# Patient Record
Sex: Female | Born: 1970 | Race: Asian | Hispanic: No | Marital: Single | Smoking: Never smoker
Health system: Southern US, Community
[De-identification: ages and names within clinical notes are randomized; demographics above are authoritative.]

## PROBLEM LIST (undated history)

## (undated) DIAGNOSIS — E7800 Pure hypercholesterolemia, unspecified: Secondary | ICD-10-CM

---

## 2003-09-24 ENCOUNTER — Emergency Department (HOSPITAL_COMMUNITY): Admission: EM | Admit: 2003-09-24 | Discharge: 2003-09-24 | Payer: Self-pay | Admitting: Emergency Medicine

## 2003-09-26 ENCOUNTER — Encounter: Admission: RE | Admit: 2003-09-26 | Discharge: 2003-09-26 | Payer: Self-pay | Admitting: Emergency Medicine

## 2003-12-26 ENCOUNTER — Emergency Department (HOSPITAL_COMMUNITY): Admission: EM | Admit: 2003-12-26 | Discharge: 2003-12-26 | Payer: Self-pay | Admitting: Family Medicine

## 2004-07-14 ENCOUNTER — Emergency Department (HOSPITAL_COMMUNITY): Admission: EM | Admit: 2004-07-14 | Discharge: 2004-07-14 | Payer: Self-pay | Admitting: Family Medicine

## 2004-08-03 ENCOUNTER — Emergency Department (HOSPITAL_COMMUNITY): Admission: EM | Admit: 2004-08-03 | Discharge: 2004-08-03 | Payer: Self-pay | Admitting: Emergency Medicine

## 2004-09-28 ENCOUNTER — Ambulatory Visit (HOSPITAL_COMMUNITY): Admission: RE | Admit: 2004-09-28 | Discharge: 2004-09-28 | Payer: Self-pay | Admitting: Family Medicine

## 2004-12-29 ENCOUNTER — Inpatient Hospital Stay (HOSPITAL_COMMUNITY): Admission: AD | Admit: 2004-12-29 | Discharge: 2004-12-30 | Payer: Self-pay | Admitting: *Deleted

## 2004-12-29 ENCOUNTER — Ambulatory Visit: Payer: Self-pay | Admitting: Family Medicine

## 2004-12-30 ENCOUNTER — Inpatient Hospital Stay (HOSPITAL_COMMUNITY): Admission: AD | Admit: 2004-12-30 | Discharge: 2004-12-30 | Payer: Self-pay | Admitting: Obstetrics and Gynecology

## 2004-12-30 ENCOUNTER — Ambulatory Visit: Payer: Self-pay | Admitting: Obstetrics and Gynecology

## 2005-01-15 ENCOUNTER — Inpatient Hospital Stay (HOSPITAL_COMMUNITY): Admission: AD | Admit: 2005-01-15 | Discharge: 2005-01-15 | Payer: Self-pay | Admitting: Obstetrics & Gynecology

## 2005-01-15 ENCOUNTER — Ambulatory Visit: Payer: Self-pay | Admitting: *Deleted

## 2005-03-11 ENCOUNTER — Inpatient Hospital Stay (HOSPITAL_COMMUNITY): Admission: AD | Admit: 2005-03-11 | Discharge: 2005-03-11 | Payer: Self-pay | Admitting: Obstetrics and Gynecology

## 2005-03-13 ENCOUNTER — Ambulatory Visit: Payer: Self-pay | Admitting: Obstetrics & Gynecology

## 2005-03-13 ENCOUNTER — Inpatient Hospital Stay (HOSPITAL_COMMUNITY): Admission: AD | Admit: 2005-03-13 | Discharge: 2005-03-16 | Payer: Self-pay | Admitting: Obstetrics & Gynecology

## 2005-03-19 ENCOUNTER — Inpatient Hospital Stay (HOSPITAL_COMMUNITY): Admission: AD | Admit: 2005-03-19 | Discharge: 2005-03-19 | Payer: Self-pay | Admitting: *Deleted

## 2005-06-10 ENCOUNTER — Emergency Department (HOSPITAL_COMMUNITY): Admission: EM | Admit: 2005-06-10 | Discharge: 2005-06-10 | Payer: Self-pay | Admitting: Emergency Medicine

## 2005-11-17 ENCOUNTER — Emergency Department (HOSPITAL_COMMUNITY): Admission: EM | Admit: 2005-11-17 | Discharge: 2005-11-17 | Payer: Self-pay | Admitting: Family Medicine

## 2006-05-17 ENCOUNTER — Encounter: Admission: RE | Admit: 2006-05-17 | Discharge: 2006-05-17 | Payer: Self-pay | Admitting: Nephrology

## 2006-05-17 ENCOUNTER — Other Ambulatory Visit: Admission: RE | Admit: 2006-05-17 | Discharge: 2006-05-17 | Payer: Self-pay | Admitting: Nephrology

## 2006-07-05 ENCOUNTER — Inpatient Hospital Stay (HOSPITAL_COMMUNITY): Admission: AD | Admit: 2006-07-05 | Discharge: 2006-07-07 | Payer: Self-pay | Admitting: Nephrology

## 2006-07-06 ENCOUNTER — Ambulatory Visit: Payer: Self-pay | Admitting: Internal Medicine

## 2006-11-14 ENCOUNTER — Other Ambulatory Visit: Admission: RE | Admit: 2006-11-14 | Discharge: 2006-11-14 | Payer: Self-pay | Admitting: Nephrology

## 2008-01-23 ENCOUNTER — Emergency Department (HOSPITAL_COMMUNITY): Admission: EM | Admit: 2008-01-23 | Discharge: 2008-01-23 | Payer: Self-pay | Admitting: Emergency Medicine

## 2008-08-05 ENCOUNTER — Emergency Department (HOSPITAL_COMMUNITY): Admission: EM | Admit: 2008-08-05 | Discharge: 2008-08-05 | Payer: Self-pay | Admitting: Emergency Medicine

## 2010-03-15 ENCOUNTER — Encounter: Payer: Self-pay | Admitting: Obstetrics & Gynecology

## 2010-08-14 ENCOUNTER — Ambulatory Visit
Admission: RE | Admit: 2010-08-14 | Discharge: 2010-08-14 | Disposition: A | Discharge status: Home / Self Care | Payer: Medicaid Other | Source: Ambulatory Visit | Attending: Nephrology | Admitting: Nephrology

## 2010-08-14 ENCOUNTER — Other Ambulatory Visit: Payer: Self-pay | Admitting: Nephrology

## 2010-08-14 DIAGNOSIS — W19XXXA Unspecified fall, initial encounter: Secondary | ICD-10-CM

## 2010-08-14 DIAGNOSIS — R52 Pain, unspecified: Secondary | ICD-10-CM

## 2011-10-18 ENCOUNTER — Encounter (HOSPITAL_COMMUNITY): Payer: Self-pay | Admitting: *Deleted

## 2011-10-18 ENCOUNTER — Emergency Department (HOSPITAL_COMMUNITY)
Admission: EM | Admit: 2011-10-18 | Discharge: 2011-10-18 | Disposition: A | Discharge status: Home / Self Care | Payer: Medicaid Other | Source: Home / Self Care | Attending: Emergency Medicine | Admitting: Emergency Medicine

## 2011-10-18 DIAGNOSIS — R21 Rash and other nonspecific skin eruption: Secondary | ICD-10-CM

## 2011-10-18 MED ORDER — PREDNISONE 10 MG PO TABS: ORAL_TABLET | ORAL | Status: DC

## 2011-10-18 NOTE — ED Notes (Signed)
Pt  Reports  She  Was  Stung   1  Week  Ago  By  [employer]  While  Working in  Her  Garden  She  Reports  Itching        And  Rash  Present   -  No  angiodema          -  No distress       -  Speaking in  Complete  sentances     The  Stings  Are  Mainly on  Extremities

## 2011-10-18 NOTE — ED Provider Notes (Signed)
History     CSN: [id number]  Arrival date & time 10/18/11  1322   First MD Initiated Contact with Patient 10/18/11 1526      Chief Complaint  Patient presents with  . Insect Bite    (Consider location/radiation/quality/duration/timing/severity/associated sxs/prior treatment) Patient is a 41 y.o. female presenting with rash. The history is provided by the patient. No language interpreter was used.  Rash  This is a new problem. The problem has been gradually worsening. The problem is associated with an insect bite/sting. There has been no fever. The pain has been constant since onset. Associated symptoms include itching.   Pt reports she was stung by a bee and now has a rash.  History reviewed. No pertinent past medical history.  History reviewed. No pertinent past surgical history.  No family history on file.  History  Substance Use Topics  . Smoking status: Never Smoker   . Smokeless tobacco: Not on file  . Alcohol Use: No    OB History    Grav Para Term Preterm Abortions TAB SAB Ect Mult Living                  Review of Systems  Skin: Positive for itching and rash.    Allergies  Review of patient's allergies indicates not on file.  Home Medications   Current Outpatient Rx  Name Route Sig Dispense Refill  . PREDNISONE 10 MG PO TABS  6,6,5,5,4,4,3,3,2,2,1,1 taper 42 tablet 0    BP 124/59  Pulse 83  Temp 98.3 F (36.8 C) (Oral)  Resp 16  SpO2 98%  Physical Exam  Nursing note and vitals reviewed. Constitutional: She appears well-developed and well-nourished.  HENT:  Head: Normocephalic and atraumatic.  Musculoskeletal:       Rash upper arms and legs,  Looks like poison ivy  Neurological: She is alert. She has normal reflexes.  Skin: Rash noted.  Psychiatric: She has a normal mood and affect.    ED Course  Procedures (including critical care time)  Labs Reviewed - No data to display No results found.   1. Skin rash       MDM     Prednisone taper      Elson Areas, [place] 10/18/11 1552

## 2011-10-18 NOTE — ED Provider Notes (Signed)
Medical screening examination/treatment/procedure(s) were performed by non-physician practitioner and as supervising physician I was immediately available for consultation/collaboration.  Raynald Blend, MD 10/18/11 [phone]

## 2011-10-26 ENCOUNTER — Telehealth: Payer: Self-pay | Admitting: *Deleted

## 2011-10-26 NOTE — Telephone Encounter (Signed)
Message copied by Jose Persia on Tue Oct 26, 2011 11:08 AM ------      Message from: Darci Needle      Created: Tue Oct 26, 2011 10:32 AM       Need NPI for patient that was at [facility] last Mon, 8/26      [facility] - [id number]

## 2011-10-26 NOTE — Telephone Encounter (Signed)
Patient was seen at [facility] urgent care for rash.  Patient does not have a PCP, but our office is listed on Medicaid card.  Due to patient having Medicaid, office calling to request NPI #  to authorize appt.  NPI # given.  Gaylene Brooks, RN

## 2011-12-09 ENCOUNTER — Other Ambulatory Visit: Payer: Self-pay | Admitting: Obstetrics

## 2011-12-09 DIAGNOSIS — Z1231 Encounter for screening mammogram for malignant neoplasm of breast: Secondary | ICD-10-CM

## 2011-12-23 ENCOUNTER — Ambulatory Visit (HOSPITAL_COMMUNITY)
Admission: RE | Admit: 2011-12-23 | Discharge: 2011-12-23 | Disposition: A | Discharge status: Home / Self Care | Payer: Medicaid Other | Source: Ambulatory Visit | Attending: Obstetrics | Admitting: Obstetrics

## 2011-12-23 DIAGNOSIS — Z1231 Encounter for screening mammogram for malignant neoplasm of breast: Secondary | ICD-10-CM

## 2011-12-27 ENCOUNTER — Other Ambulatory Visit: Payer: Self-pay | Admitting: Obstetrics

## 2011-12-27 DIAGNOSIS — R928 Other abnormal and inconclusive findings on diagnostic imaging of breast: Secondary | ICD-10-CM

## 2012-01-05 ENCOUNTER — Other Ambulatory Visit: Payer: Medicaid Other

## 2012-03-20 ENCOUNTER — Emergency Department (HOSPITAL_COMMUNITY)
Admission: EM | Admit: 2012-03-20 | Discharge: 2012-03-20 | Disposition: A | Discharge status: Home / Self Care | Payer: Medicaid Other | Attending: Emergency Medicine | Admitting: Emergency Medicine

## 2012-03-20 ENCOUNTER — Encounter (HOSPITAL_COMMUNITY): Payer: Self-pay | Admitting: Emergency Medicine

## 2012-03-20 ENCOUNTER — Emergency Department (HOSPITAL_COMMUNITY): Payer: Medicaid Other

## 2012-03-20 DIAGNOSIS — Y939 Activity, unspecified: Secondary | ICD-10-CM | POA: Insufficient documentation

## 2012-03-20 DIAGNOSIS — S93609A Unspecified sprain of unspecified foot, initial encounter: Secondary | ICD-10-CM | POA: Insufficient documentation

## 2012-03-20 DIAGNOSIS — W010XXA Fall on same level from slipping, tripping and stumbling without subsequent striking against object, initial encounter: Secondary | ICD-10-CM | POA: Insufficient documentation

## 2012-03-20 DIAGNOSIS — Y9289 Other specified places as the place of occurrence of the external cause: Secondary | ICD-10-CM | POA: Insufficient documentation

## 2012-03-20 MED ORDER — DICLOFENAC SODIUM 1 % TD GEL: 2.0000 g | Freq: Four times a day (QID) | TRANSDERMAL | Status: DC

## 2012-03-20 MED ORDER — IBUPROFEN 600 MG PO TABS: 600.0000 mg | ORAL_TABLET | Freq: Four times a day (QID) | ORAL | Status: DC | PRN

## 2012-03-20 NOTE — ED Provider Notes (Signed)
Medical screening examination/treatment/procedure(s) were performed by non-physician practitioner and as supervising physician I was immediately available for consultation/collaboration.   Reynoldo Mainer, MD 03/20/12 1824 

## 2012-03-20 NOTE — ED Notes (Signed)
Fell 3 days ago and hurt  Left  Ankle pain can walk  But hurts

## 2012-03-20 NOTE — ED Provider Notes (Signed)
History     CSN: [id number]  Arrival date & time 03/20/12  1210   First MD Initiated Contact with Patient 03/20/12 1535      Chief Complaint  Patient presents with  . Foot Pain    (Consider location/radiation/quality/duration/timing/severity/associated sxs/prior treatment) HPI Comments: Patient tripped and fell in a parking lot 3 days ago. States she has had L ankle and foot pain since that time. No treatments PTA. Denies other injury. Walking with pain. No weakness, numbness, tingling. Onset acute. Course constant. Aggravating factors: none. Alleviating factors: none.    The history is provided by the patient.    History reviewed. No pertinent past medical history.  No past surgical history on file.  No family history on file.  History  Substance Use Topics  . Smoking status: Never Smoker   . Smokeless tobacco: Not on file  . Alcohol Use: No    OB History    Grav Para Term Preterm Abortions TAB SAB Ect Mult Living                  Review of Systems  Constitutional: Negative for activity change.  HENT: Negative for neck pain.   Musculoskeletal: Positive for arthralgias. Negative for back pain, joint swelling and gait problem.  Skin: Negative for wound.  Neurological: Negative for weakness and numbness.    Allergies  Review of patient's allergies indicates no known allergies.  Home Medications   Current Outpatient Rx  Name  Route  Sig  Dispense  Refill  . DICLOFENAC SODIUM 1 % TD GEL   Topical   Apply 2 g topically 4 (four) times daily.   1 Tube   0   . IBUPROFEN 600 MG PO TABS   Oral   Take 1 tablet (600 mg total) by mouth every 6 (six) hours as needed for pain.   20 tablet   0     BP 113/51  Pulse 82  Temp 98.4 F (36.9 C) (Oral)  Resp 18  SpO2 100%  Physical Exam  Nursing note and vitals reviewed. Constitutional: She appears well-developed and well-nourished.  HENT:  Head: Normocephalic and atraumatic.  Eyes: Pupils are equal, round,  and reactive to light.  Neck: Normal range of motion. Neck supple.  Cardiovascular: Exam reveals no decreased pulses.   Musculoskeletal: She exhibits tenderness. She exhibits no edema.       Left knee: Normal.       Left ankle: tenderness. Medial malleolus tenderness found. No lateral malleolus, no head of 5th metatarsal and no proximal fibula tenderness found. Achilles tendon normal.       Left lower leg: Normal.       Left foot: She exhibits tenderness and bony tenderness. She exhibits normal range of motion, no swelling and normal capillary refill.       Feet:  Neurological: She is alert. No sensory deficit.       Motor, sensation, and vascular distal to the injury is fully intact.   Skin: Skin is warm and dry.  Psychiatric: She has a normal mood and affect.    ED Course  Procedures (including critical care time)  Labs Reviewed - No data to display Dg Ankle Complete Left  03/20/2012  *RADIOLOGY REPORT*  Clinical Data: Larey Seat.  Pain lateral malleolus.  LEFT ANKLE COMPLETE - 3+ VIEW  Comparison: None.  Findings: No fracture or dislocation.  IMPRESSION: No fracture.   Original Report Authenticated By: Lacy Duverney, M.D.      1.  Sprain of foot     3:52 PM Patient seen and examined.    Vital signs reviewed and are as follows: Filed Vitals:   03/20/12 1244  BP: 113/51  Pulse: 82  Temp: 98.4 F (36.9 C)  Resp: 18   Pt informed of results. She wants topical medication for pain.   Patient was counseled on RICE protocol and told to rest injury, use ice for no longer than 15 minutes every hour, compress the area, and elevate above the level of their heart as much as possible to reduce swelling.  Questions answered.  Patient verbalized understanding.      MDM  X-ray neg, foot sprain. RICE with NSAIDs indicated. PCP f/u if not improving.         Renne Crigler, [place] 03/20/12 [phone]

## 2012-04-03 ENCOUNTER — Other Ambulatory Visit: Payer: Medicaid Other

## 2012-04-21 ENCOUNTER — Other Ambulatory Visit: Payer: Medicaid Other

## 2013-02-08 ENCOUNTER — Encounter (HOSPITAL_COMMUNITY): Payer: Self-pay | Admitting: Emergency Medicine

## 2013-02-08 ENCOUNTER — Emergency Department (HOSPITAL_COMMUNITY)
Admission: EM | Admit: 2013-02-08 | Discharge: 2013-02-08 | Disposition: A | Discharge status: Home / Self Care | Payer: Medicaid Other | Attending: Emergency Medicine | Admitting: Emergency Medicine

## 2013-02-08 DIAGNOSIS — L02219 Cutaneous abscess of trunk, unspecified: Secondary | ICD-10-CM | POA: Insufficient documentation

## 2013-02-08 DIAGNOSIS — L0291 Cutaneous abscess, unspecified: Secondary | ICD-10-CM

## 2013-02-08 MED ORDER — SULFAMETHOXAZOLE-TRIMETHOPRIM 800-160 MG PO TABS: 1.0000 | ORAL_TABLET | Freq: Two times a day (BID) | ORAL | Status: AC

## 2013-02-08 NOTE — ED Notes (Signed)
Pt reports right groin pain x 3 days, denies n/v/d or urinary symptoms.

## 2013-02-08 NOTE — ED Notes (Signed)
Nurse first rounding, pt updated on wait.

## 2013-02-08 NOTE — ED Provider Notes (Signed)
CSN: [id number]     Arrival date & time 02/08/13  1218 History   First MD Initiated Contact with Patient 02/08/13 1608     Chief Complaint  Patient presents with  . Groin Pain   (Consider location/radiation/quality/duration/timing/severity/associated sxs/prior Treatment) HPI Bonnie Stewart is a 42 year old female with no significant past medical history who presents today with a small abscess in her right inguinal area. Patient states has been ongoing for 3 days. The patient says she's had this before approximately 6 months ago and was treated with antibiotics and that it resolved. She denies any fever nausea vomiting diarrhea. Denies any vaginal bleeding any vaginal discharge.  Patient is having mild pain in the right groin no alleviating or associated signs or symptoms no radiation of the pain pain is sharp.  History reviewed. No pertinent past medical history. History reviewed. No pertinent past surgical history. History reviewed. No pertinent family history. History  Substance Use Topics  . Smoking status: Never Smoker   . Smokeless tobacco: Not on file  . Alcohol Use: No   OB History   Grav Para Term Preterm Abortions TAB SAB Ect Mult Living                 Review of Systems  Constitutional: Negative for fever and chills.  HENT: Negative for sore throat.   Eyes: Negative for pain.  Respiratory: Negative for cough and shortness of breath.   Cardiovascular: Negative for chest pain.  Gastrointestinal: Negative for nausea, vomiting, abdominal pain and diarrhea.  Genitourinary: Negative for dysuria.  Musculoskeletal: Negative for back pain.  Skin: Positive for wound. Negative for rash.  Neurological: Negative for numbness and headaches.    Allergies  Review of patient's allergies indicates no known allergies.  Home Medications   Current Outpatient Rx  Name  Route  Sig  Dispense  Refill  . sulfamethoxazole-trimethoprim (BACTRIM DS,SEPTRA DS) 800-160 MG per tablet   Oral  Take 1 tablet by mouth 2 (two) times daily.   14 tablet   0    BP 115/75  Pulse 81  Temp(Src) 97.1 F (36.2 C) (Oral)  Resp 18  Ht 5\' 3"  (1.6 m)  Wt 137 lb (62.143 kg)  BMI 24.27 kg/m2  SpO2 99% Physical Exam  Constitutional: She is oriented to person, place, and time. She appears well-developed and well-nourished. No distress.  HENT:  Head: Normocephalic and atraumatic.  Eyes: Pupils are equal, round, and reactive to light. Right eye exhibits no discharge. Left eye exhibits no discharge.  Neck: Normal range of motion.  Cardiovascular: Normal rate, regular rhythm and normal heart sounds.   Pulmonary/Chest: Effort normal and breath sounds normal.  Abdominal: Soft. She exhibits no distension. There is no tenderness.  Musculoskeletal: Normal range of motion.  Neurological: She is alert and oriented to person, place, and time.  Skin: Skin is warm. Rash (small lesion at R groin, <30mm, indurated, erythematous. ) noted. She is not diaphoretic.    ED Course  Procedures (including critical care time) Labs Review Labs Reviewed - No data to display Imaging Review No results found.  EKG Interpretation   None       MDM   1. Abscess    42 yo F with small abscess in R groin. No fluctuance, doubt I&D will help at this time. Will treat as outpatient for cellulitis. Strict return precautions given, with instructions to follow-up with PCP in 2-3 days for wound recheck. Will d/c with bactrim for antibiotic treatment. Patient in agreement  with plan. Discharged in stable condition.         Imagene Sheller, MD 02/09/13 [phone]

## 2013-02-09 NOTE — ED Provider Notes (Signed)
I saw and evaluated the patient, reviewed the resident's note and I agree with the findings and plan.  EKG Interpretation   None       Patient not systemically ill. Not large enough for drainage. Will treat with abx.  Audree Camel, MD 02/09/13 [phone]

## 2013-02-20 ENCOUNTER — Ambulatory Visit: Payer: Medicaid Other

## 2013-02-27 ENCOUNTER — Ambulatory Visit: Payer: Medicaid Other | Attending: Internal Medicine | Admitting: Internal Medicine

## 2013-02-27 ENCOUNTER — Encounter: Payer: Self-pay | Admitting: Internal Medicine

## 2013-02-27 VITALS — BP 122/82 | HR 77 | Temp 98.7°F | Resp 14 | Ht 63.0 in | Wt 137.2 lb

## 2013-02-27 DIAGNOSIS — R5381 Other malaise: Secondary | ICD-10-CM

## 2013-02-27 DIAGNOSIS — R5383 Other fatigue: Secondary | ICD-10-CM

## 2013-02-27 DIAGNOSIS — K649 Unspecified hemorrhoids: Secondary | ICD-10-CM | POA: Insufficient documentation

## 2013-02-27 LAB — CBC WITH DIFFERENTIAL/PLATELET
Basophils Absolute: 0 10*3/uL (ref 0.0–0.1)
Basophils Relative: 0 % (ref 0–1)
EOS PCT: 1 % (ref 0–5)
Eosinophils Absolute: 0.1 10*3/uL (ref 0.0–0.7)
HCT: 37.9 % (ref 36.0–46.0)
HEMOGLOBIN: 12.6 g/dL (ref 12.0–15.0)
LYMPHS ABS: 1.5 10*3/uL (ref 0.7–4.0)
LYMPHS PCT: 23 % (ref 12–46)
MCH: 29 pg (ref 26.0–34.0)
MCHC: 33.2 g/dL (ref 30.0–36.0)
MCV: 87.1 fL (ref 78.0–100.0)
MONO ABS: 0.6 10*3/uL (ref 0.1–1.0)
Monocytes Relative: 9 % (ref 3–12)
NEUTROS ABS: 4.4 10*3/uL (ref 1.7–7.7)
NEUTROS PCT: 67 % (ref 43–77)
Platelets: 373 10*3/uL (ref 150–400)
RBC: 4.35 MIL/uL (ref 3.87–5.11)
RDW: 13.3 % (ref 11.5–15.5)
WBC: 6.5 10*3/uL (ref 4.0–10.5)

## 2013-02-27 MED ORDER — SENNOSIDES-DOCUSATE SODIUM 8.6-50 MG PO TABS: 1.0000 | ORAL_TABLET | Freq: Every day | ORAL | Status: DC

## 2013-02-27 MED ORDER — PHENYLEPH-SHARK LIV OIL-MO-PET 0.25-3-14-71.9 % RE OINT: 1.0000 "application " | TOPICAL_OINTMENT | Freq: Two times a day (BID) | RECTAL | Status: DC | PRN

## 2013-02-27 MED ORDER — HYDROCORTISONE ACETATE 25 MG RE SUPP: 25.0000 mg | Freq: Two times a day (BID) | RECTAL | Status: DC

## 2013-02-27 NOTE — Progress Notes (Signed)
Pt is here to establish care. Complains of having hemorrhoids x8 years; no pain. Requests a check up.

## 2013-02-27 NOTE — Patient Instructions (Signed)
B?nh Tr? (Hemorrhoids) Tr? l cc t?nh m?ch b? s?ng xung quanh tr?c trng ho?c h?u mn. C hai lo?i tr?:  Tr? n?i. Lo?i ny xu?t hi?n trong cc t?nh m?ch ngay bn trong tr?c trng. Chng c th? ch?c xuyn qua bn ngoi v b? kch thch v ?au.  Tr? ngo?i. Lo?i ny xu?t hi?n ? cc t?nh m?ch bn ngoi h?u mn v c th? c?m th?y s?ng ?au ho?c m?t c?c s?ng c?ng g?n h?u mn. [place].  Bo ph.  To bn ho?c tiu ch?y.  R?n ?? ??i ti?n.  Ng?i lu trong nh v? sinh.  Nng v?t n?ng ho?c ho?t ??ng khc khi?n b?n c?ng th?ng.  Giao h?p qua ???ng h?u mn. TRI?U CH?NG  ?au.  Ng?a ho?c kch thch h?u mn.  Ch?y mu tr?c trng.  R r? phn.  S?ng h?u mn.  M?t ho?c nhi?u c?c xung quanh h?u mn. CH?N ?ON Chuyn gia ch?m [place] s?c kh?e c th? ch?n ?on b?nh tr? b?ng cch khm b?ng m?t th??ng. Cc ki?m tra v xt nghi?m khc c th? ???c th?c hi?n bao g?m:  Ki?m tra khu v?c tr?c trng b?ng tay ?eo g?ng (ki?m tra tr?c trng b?ng ngn tay).  Ki?m tra ?ng h?u mn b?ng cch s? d?ng m?t ?ng nh? (?ng soi).  Xt nghi?m mu n?u b?n b? m?t m?t l??ng mu ?ng k?.  M?t ki?m tra ?? xem bn trong ??i trng (soi ??i trng sigma ho??c soi ??i trng). ?I?U TR? H?u h?t b?nh tr? c th? ???c ?i?u tr? t?i nh. Tuy nhin, n?u cc tri?u ch?ng c v? khng kh h?n ho?c n?u b?n b? ch?y mu tr?c trng nhi?u, chuyn gia ch?m [place] s?c kh?e c th? th?c hi?n m?t th? thu?t ?? gip lm cho tr? nh? h?n ho?c lo?i b? chng hon ton. Cc ph??ng php ?i?u tr? c th? bao g?m:  ??t m?t b?ng cao su t?i chn tr? ?? c?t ??t tu?n hon mu (th?t ??ng m?ch b?ng b?ng cao su).  Tim ha ch?t ?? thu nh? tr? (li?u php x? ho).  S? d?ng m?t d?ng c? ?? ??t tr? (li?u php nh sng h?ng ngo?i).  Ph?u thu?t lo?i b? tr? (th? thu?t c?t tr?).  K?p tr? ?? ch?n dng mu ??n m (k?p tr?). H??NG D?N CH?M [place] T?I NH  ?n th?c ?n c nhi?u ch?t x? nh? ng? c?c, ??u, cc lo?i h?t, tri cy v rau. Hy h?i bc s? v? vi?c dng  cc s?n ph?m c b? sung ch?t x? (b? sung ch?t x?).  U?ng nhi?u n??c h?n. U?ng ?? n??c v dung d?ch ?? n??c ti?u trong ho?c c mu vng nh?t.  T?p th? d?c th??ng xuyn.  ?i v? sinh khi b?n c nhu c?u. Khng ch?.  Trnh r?n khi ?i ??i ti?n.  Gi? vng h?u mn kh v s?ch s?. S? d?ng gi?y v? sinh ??t ho?c kh?n gi?y ?m sau khi ??i ti?n.  Thu?c kem v thu?c ??n c th? ???c s? d?ng ho?c p d?ng theo ch? d?n.  Ch? s? d?ng thu?c khng c?n k toa ho?c thu?c c?n k toa theo ch? d?n c?a chuyn gia ch?m [place] s?c kh?e.  T?m ng?i trong n??c ?m kho?ng 15-20 pht, 3-4 l?n m?t ngy ?? gi?m ?au v kh ch?u.  Ch??m ? l?nh vo ch? tr? n?u n nh?y c?m ?au v s?ng ln. Vi?c s? d?ng cc gi ch??m ? gi?a cc l?n t?m ng?i c th? h?u ch.  Cho ?  l?nh vo ti nh?a.  ??t kh?n t?m gi?a da v ti.  ?? ? l?nh kho?ng 15-20 pht, 3-4 l?n m?t ngy.  Khng s? d?ng g?i hnh bnh rn ho?c ng?i lu trn b?n c?u. ?i?u ny lm t?ng tch t? mu v ?au. HY ?I KHM N?U:  B?n b? ?au v s?ng t?ng ln khng ki?m sot ???c b?ng ?i?u tr? ho?c thu?c.  B?n b? ch?y mu khng ki?m sot ???c.  B?n g?p kh kh?n ho?c khng th? ?i ??i ti?n.  B?n b? ?au ho?c vim nhi?m bn ngoi khu v?c tr?Marland Kitchen. ??M B?O B?N:  Hi?u cc h??ng d?n ny.  S? theo di tnh tr?ng c?a mnh.  S? yu c?u tr? gip ngay l?p t?c n?u b?n c?m th?y khng ?? ho?c tnh tr?ng tr?m tr?ng h?n. Document Released: 11/18/2004 Document Revised: 10/11/2012 [facility]Exit[facility] Patient Information 2014 Homer C JonesExitCare, MarylandLLC.

## 2013-02-27 NOTE — Progress Notes (Signed)
Patient ID: Bonnie Stewart, female   DOB: March 23, 1970, 43 y.o.   MRN: [id number]   CC: Physical exam  HPI: Patient is 43 year old female who presents to clinic for regular physical exam and wants medication for hemorrhoids. She claims she is feeling well and denies any specific concerns such as chest pain or shortness of breath, no abdominal or urinary symptoms. She's not taking any medication. She explains ever since her first pregnancy she has noted problems with hemorrhoids and has tendency to be constipated. She is watching her diet regularly but it is not always helpful in preventing constipation.  No Known Allergies History reviewed. No pertinent past medical history. No current outpatient prescriptions on file prior to visit.   No current facility-administered medications on file prior to visit.   No known family medical history History   Social History  . Marital Status: Single    Spouse Name: N/A    Number of Children: N/A  . Years of Education: N/A   Occupational History  . Not on file.   Social History Main Topics  . Smoking status: Never Smoker   . Smokeless tobacco: Not on file  . Alcohol Use: No  . Drug Use:   . Sexual Activity:    Other Topics Concern  . Not on file   Social History Narrative  . No narrative on file    Review of Systems  Constitutional: Negative for fever, chills, diaphoresis, activity change, appetite change and fatigue.  HENT: Negative for ear pain, nosebleeds, congestion, facial swelling, rhinorrhea, neck pain, neck stiffness and ear discharge.   Eyes: Negative for pain, discharge, redness, itching and visual disturbance.  Respiratory: Negative for cough, choking, chest tightness, shortness of breath, wheezing and stridor.   Cardiovascular: Negative for chest pain, palpitations and leg swelling.  Gastrointestinal: Negative for abdominal distention.  Genitourinary: Negative for dysuria, urgency, frequency, hematuria, flank pain, decreased  urine volume, difficulty urinating and dyspareunia.  Musculoskeletal: Negative for back pain, joint swelling, arthralgias and gait problem.  Neurological: Negative for dizziness, tremors, seizures, syncope, facial asymmetry, speech difficulty, weakness, light-headedness, numbness and headaches.  Hematological: Negative for adenopathy. Does not bruise/bleed easily.  Psychiatric/Behavioral: Negative for hallucinations, behavioral problems, confusion, dysphoric mood, decreased concentration and agitation.    Objective:   Filed Vitals:   02/27/13 1121  BP: 122/82  Pulse: 77  Temp: 98.7 F (37.1 C)  Resp: 14    Physical Exam  Constitutional: Appears well-developed and well-nourished. No distress.  HENT: Normocephalic. External right and left ear normal. Oropharynx is clear and moist.  Eyes: Conjunctivae and EOM are normal. PERRLA, no scleral icterus.  Neck: Normal ROM. Neck supple. No JVD. No tracheal deviation. No thyromegaly.  CVS: RRR, S1/S2 +, no murmurs, no gallops, no carotid bruit.  Pulmonary: Effort and breath sounds normal, no stridor, rhonchi, wheezes, rales.  Abdominal: Soft. BS +,  no distension, tenderness, rebound or guarding.  Musculoskeletal: Normal range of motion. No edema and no tenderness.  Lymphadenopathy: No lymphadenopathy noted, cervical, inguinal. Neuro: Alert. Normal reflexes, muscle tone coordination. No cranial nerve deficit. Skin: Skin is warm and dry. No rash noted. Not diaphoretic. No erythema. No pallor.  Psychiatric: Normal mood and affect. Behavior, judgment, thought content normal.   No results found for this basename: WBC, HGB, HCT, MCV, PLT   No results found for this basename: CREATININE, BUN, NA, K, CL, CO2    No results found for this basename: HGBA1C   Lipid Panel  No results found for  this basename: chol, trig, hdl, cholhdl, vldl, ldlcalc       Assessment and plan:   Hemorrhoids - will prescribe Anusol suppository and rectal ointment,  constipation management discussed in detail and patient verbalized understanding. We'll provide prescription for Senokot to take twice daily as needed. If any bleeding noted patient advised to call us back so she can be seen as soon as possible. General physical exam - stable with no acute findings, we'll check electrolyte panel, TSH and CBC, lipid panel

## 2013-02-28 LAB — COMPREHENSIVE METABOLIC PANEL
ALK PHOS: 44 U/L (ref 39–117)
ALT: 15 U/L (ref 0–35)
AST: 15 U/L (ref 0–37)
Albumin: 4.2 g/dL (ref 3.5–5.2)
BUN: 10 mg/dL (ref 6–23)
CHLORIDE: 107 meq/L (ref 96–112)
CO2: 26 meq/L (ref 19–32)
CREATININE: 0.5 mg/dL (ref 0.50–1.10)
Calcium: 9 mg/dL (ref 8.4–10.5)
GLUCOSE: 88 mg/dL (ref 70–99)
Potassium: 3.7 mEq/L (ref 3.5–5.3)
Sodium: 141 mEq/L (ref 135–145)
Total Bilirubin: 0.5 mg/dL (ref 0.3–1.2)
Total Protein: 7.1 g/dL (ref 6.0–8.3)

## 2013-02-28 LAB — LIPID PANEL
Cholesterol: 178 mg/dL (ref 0–200)
HDL: 46 mg/dL (ref >39)
LDL CALC: 122 mg/dL — AB (ref 0–99)
TRIGLYCERIDES: 50 mg/dL (ref <150)
Total CHOL/HDL Ratio: 3.9 Ratio
VLDL: 10 mg/dL (ref 0–40)

## 2013-02-28 LAB — TSH: TSH: 2.1 u[IU]/mL (ref 0.350–4.500)

## 2013-05-28 ENCOUNTER — Ambulatory Visit: Payer: Medicaid Other | Admitting: Internal Medicine

## 2013-06-07 ENCOUNTER — Encounter: Payer: Self-pay | Admitting: Obstetrics

## 2013-06-07 ENCOUNTER — Ambulatory Visit (INDEPENDENT_AMBULATORY_CARE_PROVIDER_SITE_OTHER): Payer: Medicaid Other | Admitting: Obstetrics

## 2013-06-07 VITALS — BP 116/77 | HR 79 | Temp 98.1°F | Ht 63.0 in | Wt 135.0 lb

## 2013-06-07 DIAGNOSIS — N841 Polyp of cervix uteri: Secondary | ICD-10-CM

## 2013-06-07 DIAGNOSIS — K649 Unspecified hemorrhoids: Secondary | ICD-10-CM | POA: Insufficient documentation

## 2013-06-07 DIAGNOSIS — Z0000 Encounter for general adult medical examination without abnormal findings: Secondary | ICD-10-CM

## 2013-06-07 DIAGNOSIS — R519 Headache, unspecified: Secondary | ICD-10-CM | POA: Insufficient documentation

## 2013-06-07 DIAGNOSIS — Z1239 Encounter for other screening for malignant neoplasm of breast: Secondary | ICD-10-CM

## 2013-06-07 DIAGNOSIS — R51 Headache: Secondary | ICD-10-CM

## 2013-06-07 DIAGNOSIS — Z124 Encounter for screening for malignant neoplasm of cervix: Secondary | ICD-10-CM

## 2013-06-07 DIAGNOSIS — Z113 Encounter for screening for infections with a predominantly sexual mode of transmission: Secondary | ICD-10-CM

## 2013-06-07 MED ORDER — IBUPROFEN 800 MG PO TABS: 800.0000 mg | ORAL_TABLET | Freq: Three times a day (TID) | ORAL | Status: DC | PRN

## 2013-06-07 MED ORDER — HYDROCORTISONE ACETATE 25 MG RE SUPP: 25.0000 mg | Freq: Two times a day (BID) | RECTAL | Status: DC

## 2013-06-07 NOTE — Progress Notes (Signed)
Subjective:     Bonnie Stewart is a 43 y.o. female here for a routine exam.  Current complaints: Patient is in the office today for a problem visit. Patient states she is having problems with her Hemorids and is unable to go to the bathroom. Patient states she isn't sure if she still has her IUD.  The HPI was reviewed and explored in further detail by the provider. Gynecologic History No LMP recorded. Patient is not currently having periods (Reason: IUD). Contraception: IUD Last Pap: 2014. Results were: normal Last mammogram: Patient states she has not had a mammogram yet.   Obstetric History OB History  Gravida Para Term Preterm AB SAB TAB Ectopic Multiple Living  2 2 2       2     # Outcome Date GA Lbr Len/2nd Weight Sex Delivery Anes PTL Lv  2 TRM 03/17/05    M SVD None  Y  1 TRM 09/13/01    F SVD None  Y      History reviewed. No pertinent past medical history.  History reviewed. No pertinent past surgical history.   (Not in a hospital admission) No Known Allergies  History  Substance Use Topics  . Smoking status: Never Smoker   . Smokeless tobacco: Not on file  . Alcohol Use: No    History reviewed. No pertinent family history.    Review of Systems  Constitutional: negative for fatigue and weight loss Respiratory: negative for cough and wheezing Cardiovascular: negative for chest pain, fatigue and palpitations Gastrointestinal: negative for abdominal pain and change in bowel habits Musculoskeletal:negative for myalgias Neurological: negative for gait problems and tremors Behavioral/Psych: negative for abusive relationship, depression Endocrine: negative for temperature intolerance   Genitourinary:negative for abnormal menstrual periods, genital lesions, hot flashes, sexual problems and vaginal discharge Integument/breast: negative for breast lump, breast tenderness, nipple discharge and skin lesion(s)    Objective:       General:   alert  Skin:   no rash or  abnormalities  Lungs:   clear to auscultation bilaterally  Heart:   regular rate and rhythm, S1, S2 normal, no murmur, click, rub or gallop  Breasts:   normal without suspicious masses, skin or nipple changes or axillary nodes  Abdomen:  normal findings: no organomegaly, soft, non-tender and no hernia  Pelvis:  External genitalia: normal general appearance Urinary system: urethral meatus normal and bladder without fullness, nontender Vaginal: normal without tenderness, induration or masses Cervix: endocervical polyp.  IUD string visible. Adnexa: normal bimanual exam Uterus: anteverted and non-tender, normal size   Lab Review Urine pregnancy test Labs reviewed yes Radiologic studies reviewed no  Orders Placed This Encounter  Procedures  . GC/Chlamydia Probe Amp  . WET PREP BY MOLECULAR PROBE  . MM Digital Screening    Standing Status: Future     Number of Occurrences:      Standing Expiration Date: 08/08/2014    Order Specific Question:  Reason for Exam (SYMPTOM  OR DIAGNOSIS REQUIRED)    Answer:  screening    Order Specific Question:  Is the patient pregnant?    Answer:  No    Order Specific Question:  [facility]    [facility]   Meds ordered this encounter  Medications  . hydrocortisone (ANUSOL-HC) 25 MG suppository    Sig: Place 1 suppository (25 mg total) rectally 2 (two) times daily.    Dispense:  30 suppository    Refill:  6  . ibuprofen (  ADVIL,MOTRIN) 800 MG tablet    Sig: Take 1 tablet (800 mg total) by mouth every 8 (eight) hours as needed.    Dispense:  30 tablet    Refill:  5    Assessment:    Healthy female exam.   Endocervical polyp  Headaches.  Wants Ibuprofen.   Plan:    Education reviewed: safe sex/STD prevention, self breast exams and management of endocervical polyps.. Contraception: IUD. Mammogram ordered. Follow up in: 2 weeks.   Need to obtain previous records Possible management options include:Endocervical  polypectomy Follow up as needed. Polypectomy of endocervical polyp next visit

## 2013-06-08 LAB — WET PREP BY MOLECULAR PROBE
CANDIDA SPECIES: NEGATIVE
GARDNERELLA VAGINALIS: POSITIVE — AB
TRICHOMONAS VAG: NEGATIVE

## 2013-06-08 LAB — PAP IG W/ RFLX HPV ASCU

## 2013-06-08 LAB — GC/CHLAMYDIA PROBE AMP
CT Probe RNA: NEGATIVE
GC Probe RNA: NEGATIVE

## 2013-06-26 ENCOUNTER — Ambulatory Visit: Payer: Medicaid Other | Admitting: Obstetrics

## 2013-07-03 ENCOUNTER — Ambulatory Visit (HOSPITAL_COMMUNITY): Payer: Medicaid Other

## 2013-07-04 ENCOUNTER — Ambulatory Visit (HOSPITAL_COMMUNITY): Admission: RE | Admit: 2013-07-04 | Payer: Medicaid Other | Source: Ambulatory Visit

## 2013-07-10 ENCOUNTER — Other Ambulatory Visit: Payer: Self-pay | Admitting: Obstetrics

## 2013-07-10 ENCOUNTER — Ambulatory Visit (INDEPENDENT_AMBULATORY_CARE_PROVIDER_SITE_OTHER): Payer: Medicaid Other | Admitting: Obstetrics

## 2013-07-10 ENCOUNTER — Encounter: Payer: Self-pay | Admitting: Obstetrics

## 2013-07-10 VITALS — BP 105/64 | HR 89 | Temp 99.1°F | Ht 64.0 in | Wt 134.0 lb

## 2013-07-10 DIAGNOSIS — B9689 Other specified bacterial agents as the cause of diseases classified elsewhere: Secondary | ICD-10-CM | POA: Insufficient documentation

## 2013-07-10 DIAGNOSIS — N760 Acute vaginitis: Secondary | ICD-10-CM

## 2013-07-10 DIAGNOSIS — A499 Bacterial infection, unspecified: Secondary | ICD-10-CM

## 2013-07-10 DIAGNOSIS — N841 Polyp of cervix uteri: Secondary | ICD-10-CM

## 2013-07-10 DIAGNOSIS — Z1239 Encounter for other screening for malignant neoplasm of breast: Secondary | ICD-10-CM

## 2013-07-10 MED ORDER — METRONIDAZOLE 500 MG PO TABS: 500.0000 mg | ORAL_TABLET | Freq: Two times a day (BID) | ORAL | Status: DC

## 2013-07-11 NOTE — Progress Notes (Signed)
Patient ID: Bonnie Stewart, female   DOB: 21-Sep-1970, 43 y.o.   MRN: [id number]  Chief Complaint  Patient presents with  . Follow-up    HPI Bonnie Stewart is a 43 y.o. female.  Presents for follow up of mucous polyp found on last exam.  HPI  History reviewed. No pertinent past medical history.  History reviewed. No pertinent past surgical history.  History reviewed. No pertinent family history.  Social History History  Substance Use Topics  . Smoking status: Never Smoker   . Smokeless tobacco: Not on file  . Alcohol Use: No    No Known Allergies  Current Outpatient Prescriptions  Medication Sig Dispense Refill  . ibuprofen (ADVIL,MOTRIN) 800 MG tablet Take 1 tablet (800 mg total) by mouth every 8 (eight) hours as needed.  30 tablet  5  . metroNIDAZOLE (FLAGYL) 500 MG tablet Take 1 tablet (500 mg total) by mouth 2 (two) times daily.  14 tablet  2   No current facility-administered medications for this visit.    Review of Systems Review of Systems Constitutional: negative for fatigue and weight loss Respiratory: negative for cough and wheezing Cardiovascular: negative for chest pain, fatigue and palpitations Gastrointestinal: negative for abdominal pain and change in bowel habits Genitourinary:negative Integument/breast: negative for nipple discharge Musculoskeletal:negative for myalgias Neurological: negative for gait problems and tremors Behavioral/Psych: negative for abusive relationship, depression Endocrine: negative for temperature intolerance     Blood pressure 105/64, pulse 89, temperature 99.1 F (37.3 C), height 5\' 4"  (1.626 m), weight 134 lb (60.782 kg).  Physical Exam Physical Exam General:   alert and no distress. Abdomen:  Soft, NT. Pelvic:  Cervix visualized on SSE an small endocervical polyp noted.  The polyp was grasped with dressing forcep and twisted off at base.  The specimen was submitted to pathology.  Grey, thin malodorous vaginal discharge noted.   Wet prep done.   Data Reviewed Labs  Assessment    Endocervical polyp. BV      Plan   Endocervical polypectomy done.  Metronidazole Rx for BV.    Orders Placed This Encounter  Procedures  . MM Digital Screening    Standing Status: Future     Number of Occurrences:      Standing Expiration Date: 09/10/2014    Order Specific Question:  Reason for Exam (SYMPTOM  OR DIAGNOSIS REQUIRED)    Answer:  Screening    Order Specific Question:  Is the patient pregnant?    Answer:  No    Order Specific Question:  Preferred imaging location?    [facility]   Meds ordered this encounter  Medications  . metroNIDAZOLE (FLAGYL) 500 MG tablet    Sig: Take 1 tablet (500 mg total) by mouth 2 (two) times daily.    Dispense:  14 tablet    Refill:  2      Brock Badharles A Arian Murley 07/11/2013, 2:39 AM

## 2013-12-13 ENCOUNTER — Other Ambulatory Visit: Payer: Self-pay | Admitting: Obstetrics

## 2013-12-13 ENCOUNTER — Encounter: Payer: Self-pay | Admitting: Obstetrics

## 2013-12-13 ENCOUNTER — Ambulatory Visit (INDEPENDENT_AMBULATORY_CARE_PROVIDER_SITE_OTHER): Payer: Medicaid Other | Admitting: Obstetrics

## 2013-12-13 VITALS — BP 122/80 | HR 77 | Temp 98.2°F | Ht 63.0 in | Wt 140.0 lb

## 2013-12-13 DIAGNOSIS — N760 Acute vaginitis: Secondary | ICD-10-CM

## 2013-12-13 DIAGNOSIS — Z975 Presence of (intrauterine) contraceptive device: Secondary | ICD-10-CM

## 2013-12-13 DIAGNOSIS — Z1239 Encounter for other screening for malignant neoplasm of breast: Secondary | ICD-10-CM

## 2013-12-13 DIAGNOSIS — Z23 Encounter for immunization: Secondary | ICD-10-CM

## 2013-12-13 DIAGNOSIS — B9689 Other specified bacterial agents as the cause of diseases classified elsewhere: Secondary | ICD-10-CM

## 2013-12-13 DIAGNOSIS — G4700 Insomnia, unspecified: Secondary | ICD-10-CM | POA: Insufficient documentation

## 2013-12-13 DIAGNOSIS — A499 Bacterial infection, unspecified: Secondary | ICD-10-CM

## 2013-12-13 DIAGNOSIS — R52 Pain, unspecified: Secondary | ICD-10-CM

## 2013-12-13 MED ORDER — IBUPROFEN 800 MG PO TABS: 800.0000 mg | ORAL_TABLET | Freq: Three times a day (TID) | ORAL | Status: DC | PRN

## 2013-12-13 MED ORDER — ZOLPIDEM TARTRATE 10 MG PO TABS: 5.0000 mg | ORAL_TABLET | Freq: Every evening | ORAL | Status: DC | PRN

## 2013-12-13 MED ORDER — METRONIDAZOLE 500 MG PO TABS: 500.0000 mg | ORAL_TABLET | Freq: Two times a day (BID) | ORAL | Status: DC

## 2013-12-13 MED ORDER — ZOLPIDEM TARTRATE 5 MG PO TABS: 5.0000 mg | ORAL_TABLET | Freq: Every evening | ORAL | Status: DC | PRN

## 2013-12-13 NOTE — Addendum Note (Signed)
Addended by: Odessa FlemingBOHNE, Nyajah Hyson M on: 12/13/2013 04:50 PM   Modules accepted: Orders, SmartSet

## 2013-12-13 NOTE — Progress Notes (Signed)
Patient ID: Bonnie Stewart, female   DOB: 1970-10-16, 43 y.o.   MRN: [name]  Chief Complaint  Patient presents with  . Follow-up    IUD     HPI Bonnie Stewart is a 43 y.o. female.  Malodorous vaginal discharge.  HPI  History reviewed. No pertinent past medical history.  History reviewed. No pertinent past surgical history.  History reviewed. No pertinent family history.  Social History History  Substance Use Topics  . Smoking status: Never Smoker   . Smokeless tobacco: Not on file  . Alcohol Use: No    No Known Allergies  Current Outpatient Prescriptions  Medication Sig Dispense Refill  . ibuprofen (ADVIL,MOTRIN) 800 MG tablet Take 1 tablet (800 mg total) by mouth every 8 (eight) hours as needed.  30 tablet  5  . metroNIDAZOLE (FLAGYL) 500 MG tablet Take 1 tablet (500 mg total) by mouth 2 (two) times daily.  14 tablet  2  . zolpidem (AMBIEN) 5 MG tablet Take 1 tablet (5 mg total) by mouth at bedtime as needed for sleep.  30 tablet  2   No current facility-administered medications for this visit.    Review of Systems Review of Systems Constitutional: negative for fatigue and weight loss Respiratory: negative for cough and wheezing Cardiovascular: negative for chest pain, fatigue and palpitations Gastrointestinal: negative for abdominal pain and change in bowel habits Genitourinary: positive for malodorous vaginal discharge Integument/breast: negative for nipple discharge Musculoskeletal:negative for myalgias Neurological: negative for gait problems and tremors Behavioral/Psych: negative for abusive relationship, depression Endocrine: negative for temperature intolerance     Blood pressure 122/80, pulse 77, temperature 98.2 F (36.8 C), height 5\' 3"  (1.6 m), weight 140 lb (63.504 kg).  Physical Exam Physical Exam General:   alert  Skin:   no rash or abnormalities  Lungs:   clear to auscultation bilaterally  Heart:   regular rate and rhythm, S1, S2 normal, no  murmur, click, rub or gallop  Breasts:   normal without suspicious masses, skin or nipple changes or axillary nodes  Abdomen:  normal findings: no organomegaly, soft, non-tender and no hernia  Pelvis:  External genitalia: normal general appearance Urinary system: urethral meatus normal and bladder without fullness, nontender Vaginal: grey, thin malodorous discharge Cervix: normal appearance.  IUD string visible. Adnexa: normal bimanual exam Uterus: anteverted and non-tender, normal size      Data Reviewed Labs  Assessment    Malodorous vaginal discharge - BV. IUD Surveillance.  Pleased with IUD. Insomnia     Plan   Metronidazole Rx Continue IUD. ( inserted in 2012 ) Ambien Rx  Orders Placed This Encounter  Procedures  . MM Digital Screening    [place]/medicaid/[id number].    Standing Status: Future     Number of Occurrences:      Standing Expiration Date: 02/13/2015    Order Specific Question:  Reason for Exam (SYMPTOM  OR DIAGNOSIS REQUIRED)    Answer:  screening    Order Specific Question:  Is the patient pregnant?    Answer:  No    Order Specific Question:  Preferred imaging location?    Answer[facility]   Meds ordered this encounter  Medications  . metroNIDAZOLE (FLAGYL) 500 MG tablet    Sig: Take 1 tablet (500 mg total) by mouth 2 (two) times daily.    Dispense:  14 tablet    Refill:  2  . DISCONTD: zolpidem (AMBIEN) 10 MG tablet    Sig: Take 0.5 tablets (  5 mg total) by mouth at bedtime as needed for sleep.    Dispense:  30 tablet    Refill:  3  . ibuprofen (ADVIL,MOTRIN) 800 MG tablet    Sig: Take 1 tablet (800 mg total) by mouth every 8 (eight) hours as needed.    Dispense:  30 tablet    Refill:  5  . zolpidem (AMBIEN) 5 MG tablet    Sig: Take 1 tablet (5 mg total) by mouth at bedtime as needed for sleep.    Dispense:  30 tablet    Refill:  2       Carlo Guevarra A 12/13/2013, 11:29 AM

## 2013-12-13 NOTE — Addendum Note (Signed)
Addended by: Odessa FlemingBOHNE, Ritter Helsley M on: 12/13/2013 11:56 AM   Modules accepted: Orders

## 2013-12-14 LAB — WET PREP BY MOLECULAR PROBE
CANDIDA SPECIES: NEGATIVE
Gardnerella vaginalis: POSITIVE — AB
TRICHOMONAS VAG: NEGATIVE

## 2013-12-14 LAB — GC/CHLAMYDIA PROBE AMP
CT Probe RNA: NEGATIVE
GC Probe RNA: NEGATIVE

## 2013-12-15 ENCOUNTER — Other Ambulatory Visit: Payer: Self-pay | Admitting: Obstetrics

## 2013-12-24 ENCOUNTER — Encounter: Payer: Self-pay | Admitting: Obstetrics

## 2014-01-08 ENCOUNTER — Other Ambulatory Visit: Payer: Self-pay | Admitting: Obstetrics

## 2014-01-08 ENCOUNTER — Ambulatory Visit (HOSPITAL_COMMUNITY)
Admission: RE | Admit: 2014-01-08 | Discharge: 2014-01-08 | Disposition: A | Discharge status: Home / Self Care | Payer: Medicaid Other | Source: Ambulatory Visit | Attending: Obstetrics | Admitting: Obstetrics

## 2014-01-08 DIAGNOSIS — Z1231 Encounter for screening mammogram for malignant neoplasm of breast: Secondary | ICD-10-CM

## 2014-01-08 DIAGNOSIS — Z1239 Encounter for other screening for malignant neoplasm of breast: Secondary | ICD-10-CM

## 2014-01-10 ENCOUNTER — Other Ambulatory Visit: Payer: Self-pay | Admitting: Obstetrics

## 2014-01-10 DIAGNOSIS — R928 Other abnormal and inconclusive findings on diagnostic imaging of breast: Secondary | ICD-10-CM

## 2014-04-08 ENCOUNTER — Ambulatory Visit (INDEPENDENT_AMBULATORY_CARE_PROVIDER_SITE_OTHER): Payer: Medicaid Other | Admitting: Obstetrics

## 2014-04-08 VITALS — BP 127/81 | HR 85 | Wt 141.0 lb

## 2014-04-08 DIAGNOSIS — N898 Other specified noninflammatory disorders of vagina: Secondary | ICD-10-CM

## 2014-04-08 DIAGNOSIS — T8332XA Displacement of intrauterine contraceptive device, initial encounter: Secondary | ICD-10-CM

## 2014-04-08 DIAGNOSIS — T8389XA Other specified complication of genitourinary prosthetic devices, implants and grafts, initial encounter: Secondary | ICD-10-CM

## 2014-04-08 DIAGNOSIS — R52 Pain, unspecified: Secondary | ICD-10-CM

## 2014-04-08 MED ORDER — IBUPROFEN 800 MG PO TABS: 800.0000 mg | ORAL_TABLET | Freq: Three times a day (TID) | ORAL | Status: DC | PRN

## 2014-04-08 NOTE — Progress Notes (Signed)
Patient ID: Bonnie Stewart, female   DOB: [birth year], 44 y.o.   MRN: [id number]  Chief Complaint  Patient presents with  . Follow-up    IUD    HPI Bonnie Fothergilluong V Applegate is a 44 y.o. female.  Feels like IUD may have fallen out.Marland Kitchen.   HPI  No past medical history on file.  No past surgical history on file.  No family history on file.  Social History History  Substance Use Topics  . Smoking status: Never Smoker   . Smokeless tobacco: Not on file  . Alcohol Use: No    No Known Allergies  Current Outpatient Prescriptions  Medication Sig Dispense Refill  . ibuprofen (ADVIL,MOTRIN) 800 MG tablet Take 1 tablet (800 mg total) by mouth every 8 (eight) hours as needed. 30 tablet 5  . metroNIDAZOLE (FLAGYL) 500 MG tablet Take 1 tablet (500 mg total) by mouth 2 (two) times daily. (Patient not taking: Reported on 04/08/2014) 14 tablet 2  . zolpidem (AMBIEN) 5 MG tablet Take 1 tablet (5 mg total) by mouth at bedtime as needed for sleep. (Patient not taking: Reported on 04/08/2014) 30 tablet 2   No current facility-administered medications for this visit.    Review of Systems Review of Systems Constitutional: negative for fatigue and weight loss Respiratory: negative for cough and wheezing Cardiovascular: negative for chest pain, fatigue and palpitations Gastrointestinal: negative for abdominal pain and change in bowel habits Genitourinary: mild cramping Integument/breast: negative for nipple discharge Musculoskeletal:negative for myalgias Neurological: negative for gait problems and tremors Behavioral/Psych: negative for abusive relationship, depression Endocrine: negative for temperature intolerance     Blood pressure 127/81, pulse 85, weight 141 lb (63.957 kg).  Physical Exam Physical Exam General:   alert  Skin:   no rash or abnormalities  Lungs:   clear to auscultation bilaterally  Heart:   regular rate and rhythm, S1, S2 normal, no murmur, click, rub or gallop  Breasts:   normal  without suspicious masses, skin or nipple changes or axillary nodes  Abdomen:  normal findings: no organomegaly, soft, non-tender and no hernia  Pelvis:  External genitalia: normal general appearance Urinary system: urethral meatus normal and bladder without fullness, nontender Vaginal: normal without tenderness, induration or masses.  Increased grey, thin discharge, no odor. Cervix: normal appearance.  IUD string not visible. Adnexa: normal bimanual exam Uterus: retroverted and non-tender, normal size      Data Reviewed IUD insertion date  Assessment     Possible expulsion or malposition of IUD Vaginal discharge.      Plan    Ultrasound ordered.  Will call patient with results. Wet prep and cultures sent. F/U in 2 months for annual exam.   Orders Placed This Encounter  Procedures  . SureSwab, Vaginosis/Vaginitis Plus  . US Transvaginal Non-OB    Standing Status: Future     Number of Occurrences:      Standing Expiration Date: 06/07/2015    Order Specific Question:  Reason for Exam (SYMPTOM  OR DIAGNOSIS REQUIRED)    Answer:  iud placement    Order Specific Question:  Preferred imaging location?    Answer:  [facility]  . US Pelvis Complete    Standing Status: Future     Number of Occurrences:      Standing Expiration Date: 06/07/2015    Order Specific Question:  Reason for Exam (SYMPTOM  OR DIAGNOSIS REQUIRED)    Answer:  iud placement    Order Specific Question:  Preferred imaging location?  Answer:  [facility]  . US Transvaginal Non-OB    Standing Status: Future     Number of Occurrences:      Standing Expiration Date: 06/07/2015    Order Specific Question:  Reason for Exam (SYMPTOM  OR DIAGNOSIS REQUIRED)    Answer:  iud placement    Order Specific Question:  Preferred imaging location?    Answer:  [facility]   Meds ordered this encounter  Medications  . ibuprofen (ADVIL,MOTRIN) 800 MG tablet    Sig: Take 1 tablet (800 mg total) by mouth every 8  (eight) hours as needed.    Dispense:  30 tablet    Refill:  5

## 2014-04-09 ENCOUNTER — Ambulatory Visit (HOSPITAL_COMMUNITY)
Admission: RE | Admit: 2014-04-09 | Discharge: 2014-04-09 | Disposition: A | Discharge status: Home / Self Care | Payer: Medicaid Other | Source: Ambulatory Visit | Attending: Obstetrics | Admitting: Obstetrics

## 2014-04-09 ENCOUNTER — Encounter: Payer: Self-pay | Admitting: Obstetrics

## 2014-04-09 DIAGNOSIS — T8389XA Other specified complication of genitourinary prosthetic devices, implants and grafts, initial encounter: Secondary | ICD-10-CM | POA: Insufficient documentation

## 2014-04-09 DIAGNOSIS — T8332XA Displacement of intrauterine contraceptive device, initial encounter: Secondary | ICD-10-CM

## 2014-04-11 LAB — SURESWAB, VAGINOSIS/VAGINITIS PLUS
Atopobium vaginae: NOT DETECTED Log (cells/mL)
C. GLABRATA, DNA: NOT DETECTED
C. albicans, DNA: NOT DETECTED
C. parapsilosis, DNA: NOT DETECTED
C. trachomatis RNA, TMA: NOT DETECTED
C. tropicalis, DNA: NOT DETECTED
Gardnerella vaginalis: 4.8 Log (cells/mL)
LACTOBACILLUS SPECIES: >8 Log (cells/mL)
MEGASPHAERA SPECIES: NOT DETECTED Log (cells/mL)
N. gonorrhoeae RNA, TMA: NOT DETECTED
T. VAGINALIS RNA, QL TMA: NOT DETECTED

## 2014-07-29 ENCOUNTER — Telehealth: Payer: Self-pay | Admitting: *Deleted

## 2014-07-29 NOTE — Telephone Encounter (Signed)
Patient had called requesting an appointment -she fell in her bathroom. Declined triage by [facility] 16:04 patient left message request call back. 5:47 LM on VM- patient needs to go to Urgent care for check up- that is not a GYN problem and she may need Xray.

## 2014-08-01 ENCOUNTER — Ambulatory Visit: Payer: Medicaid Other | Admitting: Obstetrics

## 2014-08-12 ENCOUNTER — Ambulatory Visit: Payer: Medicaid Other | Admitting: Obstetrics

## 2014-08-29 ENCOUNTER — Ambulatory Visit: Payer: Medicaid Other | Admitting: Obstetrics

## 2014-11-28 ENCOUNTER — Encounter (HOSPITAL_COMMUNITY): Payer: Self-pay | Admitting: *Deleted

## 2014-11-28 ENCOUNTER — Inpatient Hospital Stay (HOSPITAL_COMMUNITY)
Admission: AD | Admit: 2014-11-28 | Discharge: 2014-11-28 | Disposition: A | Discharge status: Home / Self Care | Payer: Self-pay | Source: Ambulatory Visit | Attending: Obstetrics | Admitting: Obstetrics

## 2014-11-28 DIAGNOSIS — N889 Noninflammatory disorder of cervix uteri, unspecified: Secondary | ICD-10-CM

## 2014-11-28 LAB — URINALYSIS, ROUTINE W REFLEX MICROSCOPIC
Bilirubin Urine: NEGATIVE
GLUCOSE, UA: NEGATIVE mg/dL
Hgb urine dipstick: NEGATIVE
KETONES UR: NEGATIVE mg/dL
LEUKOCYTES UA: NEGATIVE
Nitrite: NEGATIVE
PH: 6 (ref 5.0–8.0)
Protein, ur: NEGATIVE mg/dL
Specific Gravity, Urine: 1.015 (ref 1.005–1.030)
Urobilinogen, UA: 0.2 mg/dL (ref 0.0–1.0)

## 2014-11-28 LAB — POCT PREGNANCY, URINE: Preg Test, Ur: NEGATIVE

## 2014-11-28 NOTE — MAU Provider Note (Signed)
History     CSN: [id number]  Arrival date and time: 11/28/14 1620   First Provider Initiated Contact with Patient 11/28/14 1705      No chief complaint on file.  HPI  Bonnie Stewart is a 44 y.o. G2P2002 who presents to MAU today with complaint of a bump inside her vagina. The patient states that she was seen somewhere earlier this week for evaluation of her IUD and that she was told that she had a bump inside her vagina. She denies abdominal pain, vaginal pain, discharge or fever. She has had scant blood. She states that she has not seen an OB/gyn in a long time.   OB History    Gravida Para Term Preterm AB TAB SAB Ectopic Multiple Living   History reviewed. No pertinent past medical history.  History reviewed. No pertinent past surgical history.  No family history on file.  Social History  Substance Use Topics  . Smoking status: Never Smoker   . Smokeless tobacco: None  . Alcohol Use: No    Allergies: No Known Allergies  No prescriptions prior to admission    Review of Systems  Constitutional: Negative for fever and malaise/fatigue.  Gastrointestinal: Negative for abdominal pain.  Genitourinary:       Neg - discharge + vaginal bleeding   Physical Exam   Blood pressure 123/81, pulse 80, temperature 98.6 F (37 C).  Physical Exam  Nursing note and vitals reviewed. Constitutional: She is oriented to person, place, and time. She appears well-developed and well-nourished. No distress.  HENT:  Head: Normocephalic and atraumatic.  Cardiovascular: Normal rate.   Respiratory: Effort normal.  GI: Soft. She exhibits no distension and no mass. There is no tenderness. There is no rebound and no guarding.  Genitourinary: Cervix exhibits friability. Cervix exhibits no motion tenderness and no discharge. No bleeding in the vagina. No vaginal discharge found.    Neurological: She is alert and oriented to person, place, and time.  Skin: Skin is warm  and dry. No erythema.  Psychiatric: She has a normal mood and affect.    Results for orders placed or performed during the hospital encounter of 11/28/14 (from the past 24 hour(s))  Urinalysis, Routine w reflex microscopic (not at [facility])     Status: None   Collection Time: 11/28/14  4:30 PM  Result Value Ref Range   Color, Urine YELLOW YELLOW   APPearance CLEAR CLEAR   Specific Gravity, Urine 1.015 1.005 - 1.030   pH 6.0 5.0 - 8.0   Glucose, UA NEGATIVE NEGATIVE mg/dL   Hgb urine dipstick NEGATIVE NEGATIVE   Bilirubin Urine NEGATIVE NEGATIVE   Ketones, ur NEGATIVE NEGATIVE mg/dL   Protein, ur NEGATIVE NEGATIVE mg/dL   Urobilinogen, UA 0.2 0.0 - 1.0 mg/dL   Nitrite NEGATIVE NEGATIVE   Leukocytes, UA NEGATIVE NEGATIVE  Pregnancy, urine POC     Status: None   Collection Time: 11/28/14  4:45 PM  Result Value Ref Range   Preg Test, Ur NEGATIVE NEGATIVE    MAU Course  Procedures None  MDM UPT - negative UA today  Assessment and Plan  A: Abnormal cervical growth  P: Discharge home Bleeding precautions discussed Patient advised to keep scheduled appointment for pap smear and GYN exam with Dr. Clearance Coots on 12/16/14 Patient may return to MAU as needed or if her condition were to change or worsen  Marny Lowenstein,  PA-C  11/28/2014, 6:03 PM

## 2014-11-28 NOTE — Discharge Instructions (Signed)
Pap Test WHY AM I HAVING THIS TEST? A pap test is sometimes called a pap smear. It is a screening test that is used to check for signs of cancer of the vagina, cervix, and uterus. The test can also identify the presence of infection or precancerous changes. Your health care provider will likely recommend you have this test done on a regular basis. This test may be done:  Every 3 years, starting at age 43.  Every 5 years, in combination with testing for the presence of human papillomavirus (HPV).  More or less often depending on other medical conditions.  WHAT KIND OF SAMPLE IS TAKEN? Using a small cotton swab, plastic spatula, or brush, your health care provider will collect a sample of cells from the surface of your cervix. Your cervix is the opening to your uterus, also called a womb. Secretions from the cervix and vagina may also be collected. HOW DO I PREPARE FOR THE TEST?  Be aware of where you are in your menstrual cycle. You may be asked to reschedule the test if you are menstruating on the day of the test.  You may need to reschedule if you have a known vaginal infection on the day of the test.  You may be asked to avoid douching or taking a bath the day before or the day of the test.  Some medicines can cause abnormal test results, such as digitalis and tetracycline. Talk with your health care provider before your test if you take one of these medicines. WHAT DO THE RESULTS MEAN? Abnormal test results may indicate a number of health conditions. These may include:  Cancer. Although pap test results cannot be used to diagnose cancer of the cervix, vagina, or uterus, they may suggest the possibility of cancer. Further tests would be required to determine if cancer is present.  Sexually transmitted disease.  Fungal infection.  Parasite infection.  Herpes infection.  A condition causing or contributing to infertility. It is your responsibility to obtain your test results. Ask  the lab or department performing the test when and how you will get your results. Contact your health care provider to discuss any questions you have about your results.   This information is not intended to replace advice given to you by your health care provider. Make sure you discuss any questions you have with your health care provider.   Document Released: 05/01/2002 Document Revised: 03/01/2014 Document Reviewed: 07/02/2013 Elsevier Interactive Patient Education 2016 [employer]  

## 2014-11-28 NOTE — MAU Note (Signed)
Pt presents to MAU with complaints of having a bump inside of her vagina. Denies any pain or vaginal bleeding

## 2014-12-02 ENCOUNTER — Other Ambulatory Visit: Payer: Self-pay | Admitting: Obstetrics

## 2014-12-02 DIAGNOSIS — Z1231 Encounter for screening mammogram for malignant neoplasm of breast: Secondary | ICD-10-CM

## 2014-12-11 ENCOUNTER — Ambulatory Visit
Admission: RE | Admit: 2014-12-11 | Discharge: 2014-12-11 | Disposition: A | Discharge status: Home / Self Care | Payer: No Typology Code available for payment source | Source: Ambulatory Visit | Attending: Obstetrics | Admitting: Obstetrics

## 2014-12-11 DIAGNOSIS — Z1231 Encounter for screening mammogram for malignant neoplasm of breast: Secondary | ICD-10-CM

## 2014-12-16 ENCOUNTER — Ambulatory Visit: Payer: Medicaid Other | Admitting: Obstetrics

## 2014-12-19 ENCOUNTER — Ambulatory Visit (INDEPENDENT_AMBULATORY_CARE_PROVIDER_SITE_OTHER): Payer: Self-pay | Admitting: Obstetrics and Gynecology

## 2014-12-19 ENCOUNTER — Other Ambulatory Visit (HOSPITAL_COMMUNITY)
Admission: RE | Admit: 2014-12-19 | Discharge: 2014-12-19 | Disposition: A | Discharge status: Home / Self Care | Payer: Self-pay | Source: Ambulatory Visit | Attending: Obstetrics and Gynecology | Admitting: Obstetrics and Gynecology

## 2014-12-19 ENCOUNTER — Encounter: Payer: Self-pay | Admitting: Obstetrics and Gynecology

## 2014-12-19 VITALS — BP 134/70 | HR 83 | Temp 98.2°F | Ht 61.0 in | Wt 142.6 lb

## 2014-12-19 DIAGNOSIS — N841 Polyp of cervix uteri: Secondary | ICD-10-CM | POA: Insufficient documentation

## 2014-12-19 NOTE — Progress Notes (Signed)
Interpreter Wier Siu; Pt given # to Free Pap Screening

## 2014-12-19 NOTE — Progress Notes (Signed)
Patient ID: Bonnie Stewart, female   DOB: September 11, 1970, 44 y.o.   MRN: [id number] 44 yo G2P2 presenting today as an MAU follow up for the evaluation of a cervical mass. Patient is without complaints. She denies any vaginal bleeding or pelvic pain. She is using an IUD for contraception  No past medical history on file. No past surgical history on file. No family history on file. Social History  Substance Use Topics  . Smoking status: Never Smoker   . Smokeless tobacco: None  . Alcohol Use: No   ROS See pertinent in HPI  Blood pressure 134/70, pulse 83, temperature 98.2 F (36.8 C), temperature source Oral, height 5\' 1"  (1.549 m), weight 142 lb 9.6 oz (64.683 kg). GENERAL: Well-developed, well-nourished female in no acute distress.  PELVIC: Normal external female genitalia. Vagina is pink and rugated.  Normal discharge. Normal appearing cervix with 1 cm polypoid tissue extending at external os. Uterus is normal in size. No adnexal mass or tenderness. EXTREMITIES: No cyanosis, clubbing, or edema, 2+ distal pulses.  A/P 44 yo with polyp at os - Using ring forceps, the specimen was removed without difficulty. The patient tolerated the procedure well and will be informed of pathology results - free cervical cancer screening information provided to the patient - RTC prn

## 2014-12-30 ENCOUNTER — Telehealth: Payer: Self-pay | Admitting: General Practice

## 2014-12-30 NOTE — Telephone Encounter (Signed)
Per Dr Jolayne Pantheronstant, patient's pathology shows negative cervical polyp. Called patient with [employer] interpreter [phone], no answer- left message stating we are trying to reach you with non urgent results, please call us back at the clinics

## 2014-12-31 NOTE — Telephone Encounter (Addendum)
Called patient with [employer] interpreter [phone], no answer- left message stating we are trying to reach you with results, please call us back at the clinics

## 2015-01-01 ENCOUNTER — Encounter: Payer: Self-pay | Admitting: *Deleted

## 2015-01-01 NOTE — Telephone Encounter (Signed)
Called patient with [employer] vietnamese interpreter number [phone]. No answer left message to call us back for results. Letter to patient.

## 2015-01-03 ENCOUNTER — Encounter: Payer: Self-pay | Admitting: *Deleted

## 2015-02-28 ENCOUNTER — Ambulatory Visit: Payer: Medicaid Other | Admitting: Certified Nurse Midwife

## 2015-08-25 ENCOUNTER — Ambulatory Visit: Payer: No Typology Code available for payment source | Admitting: Obstetrics and Gynecology

## 2015-08-25 NOTE — Progress Notes (Signed)
Patient was not seen by a provider. Patient is without complaints. She will follow up with PCP for annual exam and pap smear as she has orange card. Information also provided for free cervical cancer screening

## 2015-09-08 ENCOUNTER — Encounter: Payer: Self-pay | Admitting: Family Medicine

## 2015-09-08 ENCOUNTER — Ambulatory Visit (INDEPENDENT_AMBULATORY_CARE_PROVIDER_SITE_OTHER): Payer: No Typology Code available for payment source | Admitting: Family Medicine

## 2015-09-08 VITALS — BP 131/71 | HR 74 | Temp 98.4°F | Resp 16 | Ht 61.0 in | Wt 144.0 lb

## 2015-09-08 DIAGNOSIS — Z23 Encounter for immunization: Secondary | ICD-10-CM

## 2015-09-08 DIAGNOSIS — Z0000 Encounter for general adult medical examination without abnormal findings: Secondary | ICD-10-CM

## 2015-09-08 LAB — CBC WITH DIFFERENTIAL/PLATELET
BASOS PCT: 0 %
Basophils Absolute: 0 cells/uL (ref 0–200)
Eosinophils Absolute: 280 cells/uL (ref 15–500)
Eosinophils Relative: 4 %
HCT: 37.4 % (ref 35.0–45.0)
HEMOGLOBIN: 12.2 g/dL (ref 11.7–15.5)
LYMPHS PCT: 23 %
Lymphs Abs: 1610 cells/uL (ref 850–3900)
MCH: 28.8 pg (ref 27.0–33.0)
MCHC: 32.6 g/dL (ref 32.0–36.0)
MCV: 88.2 fL (ref 80.0–100.0)
MONOS PCT: 11 %
MPV: 8.2 fL (ref 7.5–12.5)
Monocytes Absolute: 770 cells/uL (ref 200–950)
NEUTROS PCT: 62 %
Neutro Abs: 4340 cells/uL (ref 1500–7800)
Platelets: 377 10*3/uL (ref 140–400)
RBC: 4.24 MIL/uL (ref 3.80–5.10)
RDW: 13.2 % (ref 11.0–15.0)
WBC: 7 10*3/uL (ref 3.8–10.8)

## 2015-09-08 LAB — HIV ANTIBODY (ROUTINE TESTING W REFLEX): HIV 1&2 Ab, 4th Generation: NONREACTIVE

## 2015-09-09 LAB — COMPLETE METABOLIC PANEL WITH GFR
ALBUMIN: 4 g/dL (ref 3.6–5.1)
ALK PHOS: 49 U/L (ref 33–115)
ALT: 13 U/L (ref 6–29)
AST: 14 U/L (ref 10–30)
BUN: 9 mg/dL (ref 7–25)
CALCIUM: 8.8 mg/dL (ref 8.6–10.2)
CO2: 24 mmol/L (ref 20–31)
Chloride: 107 mmol/L (ref 98–110)
Creat: 0.7 mg/dL (ref 0.50–1.10)
GFR, Est African American: >89 mL/min (ref >=60)
GFR, Est Non African American: >89 mL/min (ref >=60)
Glucose, Bld: 87 mg/dL (ref 65–99)
Potassium: 3.8 mmol/L (ref 3.5–5.3)
Sodium: 140 mmol/L (ref 135–146)
Total Bilirubin: 0.4 mg/dL (ref 0.2–1.2)
Total Protein: 6.8 g/dL (ref 6.1–8.1)

## 2015-09-09 LAB — LIPID PANEL
CHOLESTEROL: 162 mg/dL (ref 125–200)
HDL: 43 mg/dL — AB (ref >=46)
LDL Cholesterol: 109 mg/dL (ref <130)
Total CHOL/HDL Ratio: 3.8 Ratio (ref <=5.0)
Triglycerides: 52 mg/dL (ref <150)
VLDL: 10 mg/dL (ref <30)

## 2015-09-09 NOTE — Progress Notes (Signed)
Patient ID: Bonnie Stewart, female   DOB: 05-14-1970, 44 y.o.   MRN: [id number]   Bonnie Stewart, is a 45 y.o. female  [id number]  [id number]  DOB - [birth year]  CC:  Chief Complaint  Patient presents with  . Establish Care  . Vaginal Bleeding    abnormal vaginal bleeding, patient has an iud   . Breast Pain       HPI: Bonnie Stewart is a 45 y.o. female here to establish care. Her main concern today is needing her IUD removed. She has had for 5 years. She reports some intermittent vaginal bleeding. I have explained she needs to follow up with GYN for this. She request a dental referral with her recently acquired orange card. She is on no chronic medications and reports no chronic illnesses. She does not follow any particular diet and does not exercise regularly. She does not smoke, use alcohol or drugs. She is in need of a mammogram.   No Known Allergies History reviewed. No pertinent past medical history. Current Outpatient Prescriptions on File Prior to Visit  Medication Sig Dispense Refill  . ibuprofen (ADVIL,MOTRIN) 800 MG tablet Take 1 tablet (800 mg total) by mouth every 8 (eight) hours as needed. (Patient not taking: Reported on 12/19/2014) 30 tablet 5  . metroNIDAZOLE (FLAGYL) 500 MG tablet Take 1 tablet (500 mg total) by mouth 2 (two) times daily. (Patient not taking: Reported on 04/08/2014) 14 tablet 2  . zolpidem (AMBIEN) 5 MG tablet Take 1 tablet (5 mg total) by mouth at bedtime as needed for sleep. (Patient not taking: Reported on 04/08/2014) 30 tablet 2   No current facility-administered medications on file prior to visit.   History reviewed. No pertinent family history. Social History   Social History  . Marital Status: Single    Spouse Name: N/A  . Number of Children: N/A  . Years of Education: N/A   Occupational History  . Not on file.   Social History Main Topics  . Smoking status: Never Smoker   . Smokeless tobacco: Not on file  . Alcohol Use: No  . Drug Use:  No  . Sexual Activity: No   Other Topics Concern  . Not on file   Social History Narrative    Review of Systems: Constitutional: Negative for fever, chills, appetite change, weight loss,  Fatigue.  HENT: Negative for ear pain, ear discharge.nose bleeds Eyes: Negative for pain, discharge, redness, itching and visual disturbance. Neck: Negative for pain, stiffness Respiratory: Negative for cough, shortness of breath,   Cardiovascular: Negative for chest pain, palpitations and leg swelling. Gastrointestinal: Negative for abdominal pain, nausea, vomiting, diarrhea. She reports hemorrhoids which are aggravated by constipation Genitourinary: Negative for dysuria, urgency, frequency, hematuria,  Musculoskeletal: Negative for back pain, joint pain, joint  swelling, and gait problem.Negative for weakness. Neurological: Negative for dizziness, tremors, seizures, syncope,   light-headedness, numbness. She reports headaches which are relieved by tylenol or advil.  Hematological: Negative for easy bruising or bleeding Psychiatric/Behavioral: Negative for depression, anxiety, decreased concentration, confusion   Objective:   Filed Vitals:   09/08/15 1415  BP: 131/71  Pulse: 74  Temp: 98.4 F (36.9 C)  Resp: 16    Physical Exam: Constitutional: Patient appears well-developed and well-nourished. No distress. HENT: Normocephalic, atraumatic, External right and left ear normal. Oropharynx is clear and moist.  Eyes: Conjunctivae and EOM are normal. PERRLA, no scleral icterus. Neck: Normal ROM. Neck supple. No lymphadenopathy, No thyromegaly. CVS: RRR, S1/S2 +, no murmurs,  no gallops, no rubs Pulmonary: Effort and breath sounds normal, no stridor, rhonchi, wheezes, rales.  Abdominal: Soft. Normoactive BS,, no distension, tenderness, rebound or guarding.  Musculoskeletal: Normal range of motion. No edema and no tenderness.  Neuro: Alert.Normal muscle tone coordination. Non-focal Skin: Skin is  warm and dry. No rash noted. Not diaphoretic. No erythema. No pallor. Psychiatric: Normal mood and affect. Behavior, judgment, thought content normal.  Lab Results  Component Value Date   WBC 7.0 09/08/2015   HGB 12.2 09/08/2015   HCT 37.4 09/08/2015   MCV 88.2 09/08/2015   PLT 377 09/08/2015   Lab Results  Component Value Date   CREATININE 0.70 09/08/2015   BUN 9 09/08/2015   NA 140 09/08/2015   K 3.8 09/08/2015   CL 107 09/08/2015   CO2 24 09/08/2015    No results found for: HGBA1C Lipid Panel     Component Value Date/Time   CHOL 162 09/08/2015 1507   TRIG 52 09/08/2015 1507   HDL 43* 09/08/2015 1507   CHOLHDL 3.8 09/08/2015 1507   VLDL 10 09/08/2015 1507   LDLCALC 109 09/08/2015 1507       Assessment and plan:   1. Health care maintenance  - COMPLETE METABOLIC PANEL WITH GFR - CBC with Differential - Lipid panel - HIV antibody (with reflex) - Ambulatory referral to Dentistry - Ambulatory referral to Gynecology   Return in about 6 months (around 03/10/2016).  The patient was given clear instructions to go to ER or return to medical center if symptoms don't improve, worsen or new problems develop. The patient verbalized understanding.    Bonnie HooverLinda C Zykiria Bruening FNP  09/09/2015, 8:07 AM

## 2015-10-01 ENCOUNTER — Ambulatory Visit (INDEPENDENT_AMBULATORY_CARE_PROVIDER_SITE_OTHER): Payer: Self-pay | Admitting: Family Medicine

## 2015-10-01 ENCOUNTER — Other Ambulatory Visit (HOSPITAL_COMMUNITY)
Admission: RE | Admit: 2015-10-01 | Discharge: 2015-10-01 | Disposition: A | Discharge status: Home / Self Care | Payer: No Typology Code available for payment source | Source: Ambulatory Visit | Attending: Family Medicine | Admitting: Family Medicine

## 2015-10-01 ENCOUNTER — Encounter: Payer: Self-pay | Admitting: Family Medicine

## 2015-10-01 VITALS — BP 129/60 | HR 83 | Wt 141.5 lb

## 2015-10-01 DIAGNOSIS — N841 Polyp of cervix uteri: Secondary | ICD-10-CM

## 2015-10-01 DIAGNOSIS — Z30432 Encounter for removal of intrauterine contraceptive device: Secondary | ICD-10-CM

## 2015-10-01 NOTE — Patient Instructions (Signed)
Contraception Choices Contraception (birth control) is the use of any methods or devices to prevent pregnancy. Below are some methods to help avoid pregnancy. HORMONAL METHODS   Contraceptive implant. This is a thin, plastic tube containing progesterone hormone. It does not contain estrogen hormone. Your health care provider inserts the tube in the inner part of the upper arm. The tube can remain in place for up to 3 years. After 3 years, the implant must be removed. The implant prevents the ovaries from releasing an egg (ovulation), thickens the cervical mucus to prevent sperm from entering the uterus, and thins the lining of the inside of the uterus.  Progesterone-only injections. These injections are given every 3 months by your health care provider to prevent pregnancy. This synthetic progesterone hormone stops the ovaries from releasing eggs. It also thickens cervical mucus and changes the uterine lining. This makes it harder for sperm to survive in the uterus.  Birth control pills. These pills contain estrogen and progesterone hormone. They work by preventing the ovaries from releasing eggs (ovulation). They also cause the cervical mucus to thicken, preventing the sperm from entering the uterus. Birth control pills are prescribed by a health care provider.Birth control pills can also be used to treat heavy periods.  Minipill. This type of birth control pill contains only the progesterone hormone. They are taken every day of each month and must be prescribed by your health care provider.  Birth control patch. The patch contains hormones similar to those in birth control pills. It must be changed once a week and is prescribed by a health care provider.  Vaginal ring. The ring contains hormones similar to those in birth control pills. It is left in the vagina for 3 weeks, removed for 1 week, and then a new one is put back in place. The patient must be comfortable inserting and removing the ring  from the vagina.A health care provider's prescription is necessary.  Emergency contraception. Emergency contraceptives prevent pregnancy after unprotected sexual intercourse. This pill can be taken right after sex or up to 5 days after unprotected sex. It is most effective the sooner you take the pills after having sexual intercourse. Most emergency contraceptive pills are available without a prescription. Check with your pharmacist. Do not use emergency contraception as your only form of birth control. BARRIER METHODS   Female condom. This is a thin sheath (latex or rubber) that is worn over the penis during sexual intercourse. It can be used with spermicide to increase effectiveness.  Female condom. This is a soft, loose-fitting sheath that is put into the vagina before sexual intercourse.  Diaphragm. This is a soft, latex, dome-shaped barrier that must be fitted by a health care provider. It is inserted into the vagina, along with a spermicidal jelly. It is inserted before intercourse. The diaphragm should be left in the vagina for 6 to 8 hours after intercourse.  Cervical cap. This is a round, soft, latex or plastic cup that fits over the cervix and must be fitted by a health care provider. The cap can be left in place for up to 48 hours after intercourse.  Sponge. This is a soft, circular piece of polyurethane foam. The sponge has spermicide in it. It is inserted into the vagina after wetting it and before sexual intercourse.  Spermicides. These are chemicals that kill or block sperm from entering the cervix and uterus. They come in the form of creams, jellies, suppositories, foam, or tablets. They do not require a   prescription. They are inserted into the vagina with an applicator before having sexual intercourse. The process must be repeated every time you have sexual intercourse. INTRAUTERINE CONTRACEPTION  Intrauterine device (IUD). This is a T-shaped device that is put in a woman's uterus  during a menstrual period to prevent pregnancy. There are 2 types:  Copper IUD. This type of IUD is wrapped in copper wire and is placed inside the uterus. Copper makes the uterus and fallopian tubes produce a fluid that kills sperm. It can stay in place for 10 years.  Hormone IUD. This type of IUD contains the hormone progestin (synthetic progesterone). The hormone thickens the cervical mucus and prevents sperm from entering the uterus, and it also thins the uterine lining to prevent implantation of a fertilized egg. The hormone can weaken or kill the sperm that get into the uterus. It can stay in place for 3-5 years, depending on which type of IUD is used. PERMANENT METHODS OF CONTRACEPTION  Female tubal ligation. This is when the woman's fallopian tubes are surgically sealed, tied, or blocked to prevent the egg from traveling to the uterus.  Hysteroscopic sterilization. This involves placing a small coil or insert into each fallopian tube. Your doctor uses a technique called hysteroscopy to do the procedure. The device causes scar tissue to form. This results in permanent blockage of the fallopian tubes, so the sperm cannot fertilize the egg. It takes about 3 months after the procedure for the tubes to become blocked. You must use another form of birth control for these 3 months.  Female sterilization. This is when the female has the tubes that carry sperm tied off (vasectomy).This blocks sperm from entering the vagina during sexual intercourse. After the procedure, the man can still ejaculate fluid (semen). NATURAL PLANNING METHODS  Natural family planning. This is not having sexual intercourse or using a barrier method (condom, diaphragm, cervical cap) on days the woman could become pregnant.  Calendar method. This is keeping track of the length of each menstrual cycle and identifying when you are fertile.  Ovulation method. This is avoiding sexual intercourse during ovulation.  Symptothermal  method. This is avoiding sexual intercourse during ovulation, using a thermometer and ovulation symptoms.  Post-ovulation method. This is timing sexual intercourse after you have ovulated. Regardless of which type or method of contraception you choose, it is important that you use condoms to protect against the transmission of sexually transmitted infections (STIs). Talk with your health care provider about which form of contraception is most appropriate for you.   This information is not intended to replace advice given to you by your health care provider. Make sure you discuss any questions you have with your health care provider.   Document Released: 02/08/2005 Document Revised: 02/13/2013 Document Reviewed: 08/03/2012 Elsevier Interactive Patient Education 2016 Elsevier Inc.  

## 2015-10-01 NOTE — Progress Notes (Signed)
   Subjective:    Patient ID: Bonnie Stewart is a 45 y.o. female presenting with Vaginal Bleeding and Contraception (remove IUD)  on 10/01/2015  HPI: Here for IUD removal. In x 7 years and [facility]succes[facility].  Review of Systems  Constitutional: Negative for chills and fever.  Respiratory: Negative for shortness of breath.   Cardiovascular: Negative for chest pain.  Gastrointestinal: Negative for abdominal pain, nausea and vomiting.  Genitourinary: Negative for dysuria.  Skin: Negative for rash.      Objective:    BP 129/60 (BP Location: Right Arm, Patient Position: Sitting, Cuff Size: Normal)   Pulse 83   Wt 141 lb 8 oz (64.2 kg)   BMI 26.74 kg/m  Physical Exam  Constitutional: She is oriented to person, place, and time. She appears well-developed and well-nourished. No distress.  HENT:  Head: Normocephalic and atraumatic.  Eyes: No scleral icterus.  Neck: Neck supple.  Cardiovascular: Normal rate.   Pulmonary/Chest: Effort normal.  Abdominal: Soft.  Neurological: She is alert and oriented to person, place, and time.  Skin: Skin is warm and dry.  Psychiatric: She has a normal mood and affect.   Procedure: Speculum placed inside vagina. Cervix visualized and polyp noted.  Ring forcep applied to cervix and twisting motion removed polyp intact.    Procedure:  Cervix visualized.  Strings not visualized. IUD hook and uterine dressing forceps used to bring IUD out of uterine cavity.  IUD removed intact.  Hemostasis obtained with Monsel's solution.    Assessment & Plan:   Problem List Items Addressed This Visit    None    Visit Diagnoses    Cervical polyp    -  Primary   Relevant Orders   Surgical pathology   Encounter for IUD removal           Problem List Items Addressed This Visit    None    Visit Diagnoses    Cervical polyp    -  Primary   Relevant Orders   Surgical pathology   Encounter for IUD removal          Hartley Wyke  S 10/01/2015 2:35 PM

## 2015-10-17 ENCOUNTER — Encounter: Payer: Self-pay | Admitting: *Deleted

## 2016-03-03 ENCOUNTER — Ambulatory Visit: Payer: Self-pay | Admitting: Internal Medicine

## 2016-03-08 ENCOUNTER — Ambulatory Visit: Payer: Self-pay | Admitting: Internal Medicine

## 2016-04-15 ENCOUNTER — Ambulatory Visit: Payer: Self-pay | Admitting: Internal Medicine

## 2016-05-18 ENCOUNTER — Ambulatory Visit (INDEPENDENT_AMBULATORY_CARE_PROVIDER_SITE_OTHER): Payer: Self-pay | Admitting: Internal Medicine

## 2016-05-18 ENCOUNTER — Encounter: Payer: Self-pay | Admitting: Internal Medicine

## 2016-05-18 ENCOUNTER — Ambulatory Visit: Payer: Self-pay | Admitting: Internal Medicine

## 2016-05-18 VITALS — BP 102/70 | HR 68 | Resp 12 | Ht 59.25 in | Wt 140.0 lb

## 2016-05-18 DIAGNOSIS — R519 Headache, unspecified: Secondary | ICD-10-CM

## 2016-05-18 DIAGNOSIS — Z1239 Encounter for other screening for malignant neoplasm of breast: Secondary | ICD-10-CM

## 2016-05-18 DIAGNOSIS — R51 Headache: Secondary | ICD-10-CM

## 2016-05-18 DIAGNOSIS — T8332XA Displacement of intrauterine contraceptive device, initial encounter: Secondary | ICD-10-CM

## 2016-05-18 DIAGNOSIS — K649 Unspecified hemorrhoids: Secondary | ICD-10-CM

## 2016-05-18 DIAGNOSIS — H547 Unspecified visual loss: Secondary | ICD-10-CM

## 2016-05-18 DIAGNOSIS — Z1231 Encounter for screening mammogram for malignant neoplasm of breast: Secondary | ICD-10-CM

## 2016-05-18 NOTE — Progress Notes (Signed)
Subjective:    Patient ID: Bonnie Stewart, female    DOB: 18-Aug-1970, 46 y.o.   MRN: [id number]  HPI   Here to establish. Is originally from [place]  [place]. Lives on [address]  1.  Hemorrhoids:  Sound external.  Eats a lot of vegetables, but sometimes her stools are hard.    2.  [facility] placed:  [facility] ago [facility].  She is not sure if it is still in place.  Does not sound like she has been able to feel the strings.     3.  Headaches:  Problems for 2 years.  If takes 1 ibuprofen, her headache goes away.  Not clear if this is a prescription strength or OTC.  May have to take 5 times weekly before goes to sleep.   Has been told she needs glasses and especially has difficulty with right eye with headaches.   Last eye check was 2 years ago.   Eyes sometimes red and dry as well.  Current Meds  Medication Sig  . ibuprofen (ADVIL,MOTRIN) 200 MG tablet Take 200 mg by mouth every 6 (six) hours as needed.  Marland Kitchen. levonorgestrel (MIRENA) 20 MCG/24HR IUD 1 each by Intrauterine route once.   No Known Allergies   History reviewed. No pertinent past medical history.   History reviewed. No pertinent surgical history.   History reviewed. No pertinent family history.   Social History   Social History  . Marital status: Single    Spouse name: N/A  . Number of children: 2  . Years of education: 12 +   Occupational History  . 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 Smoking status: Never Smoker  . Smokeless tobacco: Never Used  . Alcohol use No  . Drug use: No  . Sexual activity: No   Other Topics Concern  . Not on file   Social History Narrative   Originally from [place]   [place]   [place] of family in [place], but she left and moved here with her children as her family was giving her a hard time about having children out of wedlock.    Father of children did not move with them.  He is not  supportive and does not keep in touch.      Review of Systems     Objective:   Physical Exam  NAD,somewhat unkempt HEENT:  PERRL, EOMI, TMs pearly gray, throat without injection. Neck:  Supple, No adenopathy, no thyromegaly. Chest:  CTA CV:  RRR with normal S1 and S2, No S3, S4 or murmur.  Radial and DP pulses normal and equal Abd:  S, NT, No HSM or mass, + BS GU:  Large "floret of external hemorrhoids emanating from anus.  Epithelialized on external surface, but still active when visualize inner surface, pink mucosa without bleeding currently Vaginal:  Visualized strings from cervix.  No cervical lesion.        Assessment & Plan:  1.  Concern for loss of IUD: strings visualized from cervical os  2.  External Hemorrhoids:  Quite large and would expect has significant difficulty with discomfort and keeping clean.  Referral to Gen surgery through orange card.  If unable to get a referral for this, will see about [facility], but has limited ability to travel.  3.  Headaches:  Difficult history, but sounds limited and responds easily to low dose ibuprofen.  4.  Decreased visual acuity:  Referral to Optometry and voucher for glasses.  5  HM:  Mammogram referral with scholarship.

## 2016-06-09 ENCOUNTER — Other Ambulatory Visit: Payer: Self-pay | Admitting: Internal Medicine

## 2016-06-09 DIAGNOSIS — Z1231 Encounter for screening mammogram for malignant neoplasm of breast: Secondary | ICD-10-CM

## 2016-06-14 ENCOUNTER — Ambulatory Visit
Admission: RE | Admit: 2016-06-14 | Discharge: 2016-06-14 | Disposition: A | Discharge status: Home / Self Care | Payer: No Typology Code available for payment source | Source: Ambulatory Visit | Attending: Internal Medicine | Admitting: Internal Medicine

## 2016-06-14 DIAGNOSIS — Z1231 Encounter for screening mammogram for malignant neoplasm of breast: Secondary | ICD-10-CM

## 2016-06-15 ENCOUNTER — Other Ambulatory Visit: Payer: Self-pay | Admitting: Obstetrics and Gynecology

## 2016-06-15 DIAGNOSIS — R928 Other abnormal and inconclusive findings on diagnostic imaging of breast: Secondary | ICD-10-CM

## 2016-06-22 ENCOUNTER — Other Ambulatory Visit (HOSPITAL_COMMUNITY): Payer: Self-pay | Admitting: *Deleted

## 2016-06-22 DIAGNOSIS — R928 Other abnormal and inconclusive findings on diagnostic imaging of breast: Secondary | ICD-10-CM

## 2016-07-13 ENCOUNTER — Ambulatory Visit
Admission: RE | Admit: 2016-07-13 | Discharge: 2016-07-13 | Disposition: A | Discharge status: Home / Self Care | Payer: No Typology Code available for payment source | Source: Ambulatory Visit | Attending: Obstetrics and Gynecology | Admitting: Obstetrics and Gynecology

## 2016-07-13 ENCOUNTER — Encounter (HOSPITAL_COMMUNITY): Payer: Self-pay

## 2016-07-13 ENCOUNTER — Other Ambulatory Visit (HOSPITAL_COMMUNITY): Payer: Self-pay | Admitting: Obstetrics and Gynecology

## 2016-07-13 ENCOUNTER — Ambulatory Visit (HOSPITAL_COMMUNITY)
Admission: RE | Admit: 2016-07-13 | Discharge: 2016-07-13 | Disposition: A | Discharge status: Home / Self Care | Payer: Self-pay | Source: Ambulatory Visit | Attending: Obstetrics and Gynecology | Admitting: Obstetrics and Gynecology

## 2016-07-13 VITALS — BP 108/70 | Temp 97.5°F | Ht 62.0 in | Wt 138.0 lb

## 2016-07-13 DIAGNOSIS — R928 Other abnormal and inconclusive findings on diagnostic imaging of breast: Secondary | ICD-10-CM

## 2016-07-13 DIAGNOSIS — Z01419 Encounter for gynecological examination (general) (routine) without abnormal findings: Secondary | ICD-10-CM

## 2016-07-13 DIAGNOSIS — N6310 Unspecified lump in the right breast, unspecified quadrant: Secondary | ICD-10-CM

## 2016-07-13 NOTE — Addendum Note (Signed)
Encounter addended by: Lynnell DikeHolland, Prudence Heiny H, LPN on: 8/11/91475/22/2018 10:56 AM<BR>    Actions taken: Order list changed

## 2016-07-13 NOTE — Progress Notes (Signed)
Patient referred to [facility] by the [facility] due to needing additional imaging of the right breast. Screening mammogram completed [facility]23/2018.  Pap Smear: Pap smear completed today. Last Pap smear was [facility]07/[facility] by Dr. Clearance CootsHarper and normal. Per patient has no history of an abnormal Pap smear. Last Pap smear result is in EPIC.  Physical exam: Breasts Left breast slightly larger than right. No skin abnormalities bilateral breasts. No nipple retraction bilateral breasts. No nipple discharge bilateral breasts. No lymphadenopathy. No lumps palpated bilateral breasts. No complaints of pain or tenderness on exam. [facility] for a right breast diagnostic mammogram and ultrasound per recommendation. Appointment scheduled for Tuesday, Jul 13, 2016 at 1050.  Pelvic/Bimanual   Ext Genitalia No lesions, no swelling and no discharge observed on external genitalia.         Vagina Vagina pink and normal texture. No lesions or discharge observed in vagina.          Cervix Cervix is present. Cervix pink and of normal texture. Cervix friable. No discharge observed. IUD strings visualized.     Uterus Uterus is present and palpable. Uterus in normal position and normal size.        Adnexae Bilateral ovaries present and palpable. No tenderness on palpation.          Rectovaginal No rectal exam completed today since patient had no rectal complaints. Large Hemorrhoid observed at rectal area.  Smoking History: Patient has never smoked.  Patient Navigation: Patient education provided. Access to services provided for [facility] through [facility].

## 2016-07-13 NOTE — Patient Instructions (Signed)
Explained breast self awareness with Peggye Fothergilluong V Swader. Let patient know [facility]CP will cover Pap smears and HPV typing every 5 years unless has a history of abnormal Pap smears. [facility] for a right breast diagnostic mammogram and ultrasound per recommendation. Appointment scheduled for Tuesday, Jul 13, 2016 at 1050. Let patient know will follow up with her within the next couple weeks with results of Pap smear by phone. Peggye Fothergilluong V [place] verbalized understanding.  Davene Jobin, Kathaleen Maserhristine Poll, RN 9:23 AM

## 2016-07-14 ENCOUNTER — Ambulatory Visit
Admission: RE | Admit: 2016-07-14 | Discharge: 2016-07-14 | Disposition: A | Discharge status: Home / Self Care | Payer: No Typology Code available for payment source | Source: Ambulatory Visit | Attending: Obstetrics and Gynecology | Admitting: Obstetrics and Gynecology

## 2016-07-14 ENCOUNTER — Other Ambulatory Visit (HOSPITAL_COMMUNITY): Payer: Self-pay | Admitting: Obstetrics and Gynecology

## 2016-07-14 DIAGNOSIS — N6310 Unspecified lump in the right breast, unspecified quadrant: Secondary | ICD-10-CM

## 2016-07-15 LAB — CYTOLOGY - PAP
Diagnosis: NEGATIVE
HPV (WINDOPATH): NOT DETECTED

## 2016-07-20 ENCOUNTER — Encounter (HOSPITAL_COMMUNITY): Payer: Self-pay | Admitting: *Deleted

## 2016-07-20 NOTE — Progress Notes (Signed)
Letter mailed to patient with negative pap smear results.  

## 2016-08-18 ENCOUNTER — Ambulatory Visit: Payer: Self-pay | Admitting: Internal Medicine

## 2016-12-13 ENCOUNTER — Encounter (HOSPITAL_COMMUNITY): Payer: Self-pay | Admitting: *Deleted

## 2016-12-15 ENCOUNTER — Other Ambulatory Visit: Payer: Self-pay | Admitting: Obstetrics and Gynecology

## 2016-12-15 DIAGNOSIS — N63 Unspecified lump in unspecified breast: Secondary | ICD-10-CM

## 2017-01-17 ENCOUNTER — Ambulatory Visit
Admission: RE | Admit: 2017-01-17 | Discharge: 2017-01-17 | Disposition: A | Discharge status: Home / Self Care | Payer: No Typology Code available for payment source | Source: Ambulatory Visit | Attending: Obstetrics and Gynecology | Admitting: Obstetrics and Gynecology

## 2017-01-17 DIAGNOSIS — N63 Unspecified lump in unspecified breast: Secondary | ICD-10-CM

## 2017-06-02 ENCOUNTER — Other Ambulatory Visit: Payer: Self-pay | Admitting: Obstetrics and Gynecology

## 2017-06-02 DIAGNOSIS — Z1231 Encounter for screening mammogram for malignant neoplasm of breast: Secondary | ICD-10-CM

## 2017-07-04 ENCOUNTER — Ambulatory Visit
Admission: RE | Admit: 2017-07-04 | Discharge: 2017-07-04 | Disposition: A | Discharge status: Home / Self Care | Payer: No Typology Code available for payment source | Source: Ambulatory Visit | Attending: Obstetrics and Gynecology | Admitting: Obstetrics and Gynecology

## 2017-07-04 DIAGNOSIS — Z1231 Encounter for screening mammogram for malignant neoplasm of breast: Secondary | ICD-10-CM

## 2017-09-05 ENCOUNTER — Ambulatory Visit: Payer: Self-pay | Admitting: Internal Medicine

## 2017-09-05 ENCOUNTER — Encounter: Payer: Self-pay | Admitting: Internal Medicine

## 2017-09-05 VITALS — BP 122/86 | HR 90 | Resp 12 | Ht 59.25 in | Wt 138.0 lb

## 2017-09-05 DIAGNOSIS — R519 Headache, unspecified: Secondary | ICD-10-CM

## 2017-09-05 DIAGNOSIS — R51 Headache: Secondary | ICD-10-CM

## 2017-09-05 DIAGNOSIS — G4700 Insomnia, unspecified: Secondary | ICD-10-CM

## 2017-09-05 DIAGNOSIS — R350 Frequency of micturition: Secondary | ICD-10-CM

## 2017-09-05 DIAGNOSIS — H547 Unspecified visual loss: Secondary | ICD-10-CM

## 2017-09-05 LAB — POCT URINALYSIS DIPSTICK
BILIRUBIN UA: NEGATIVE
Blood, UA: NEGATIVE
Glucose, UA: NEGATIVE
Ketones, UA: NEGATIVE
Nitrite, UA: POSITIVE
PH UA: 6.5 (ref 5.0–8.0)
Protein, UA: NEGATIVE
SPEC GRAV UA: 1.015 (ref 1.010–1.025)
UROBILINOGEN UA: 0.2 U/dL

## 2017-09-05 MED ORDER — CIPROFLOXACIN HCL 500 MG PO TABS: 500.0000 mg | ORAL_TABLET | Freq: Two times a day (BID) | ORAL | 0 refills | Status: DC

## 2017-09-05 NOTE — Progress Notes (Signed)
   Subjective:    Patient ID: Bonnie Stewart, female    DOB: 06/10/1970, 47 y.o.   MRN: [id number]  HPI  1.  Problems with sleep:  Goes to bed around 9-10 p.m.  Wakes up 2-3 a.m.  Tosses and turns in bed.  Gets out of bed maybe 3:30 a.m. And takes a shower, then watches TV for 1 hour, then tries to go back to bed.  T.V in bedroom. Still unable to fall asleep.  Just lies in bed until 6 a.m. And then gets out of bed and starts her day.  In an ESL class.  No working outside the home. Lives with her two children who are 5011 and 46 yo.    Does not feel she is stressed.  States she has had the difficulty for 2 years. She is also homesick for [place].    She is having a headache, which she also thinks makes it difficult to sleep.  Describes presbyopia she feels also is causing her headaches.  2.  Urinary frequency:  States started a couple of months ago.  Doesn't drink a lot of fluids.  She doesn't need necessarily to urinate a large volume--can be small.  She has had times when she is a bit incontinent, attempting to make it to bathroom in time. No burning on urination. No suprapubic or flank pain. No fever.  No hematuria.    Current Meds  Medication Sig  . levonorgestrel (MIRENA) 20 MCG/24HR IUD 1 each by Intrauterine route once.    No Known Allergies  Review of Systems     Objective:   Physical Exam  NAD HEENT:  PERRL, EOMI, discs sharp,  TMs pearly gray, throat without injection. Neck:  Supple, No adenopathy, no thryomegaly Chest:  CTA CV: RRR without murmur or rub.  Radial and DP pulses normal and equal. Abd: S, NT, No HSM or mass, + BS.  No flank or suprapubic pain. Neuro:  A & O x 3, CN  II-XII grossly intact.  DTRs 2+/4 throughout, Motor 5/5 throughout.  Sensory grossly normal.  Gait normal.  UA with + nitrites and small leuk    Assessment & Plan:  1.  Decreased visual acuity:  Recommend eye referral--flyer given for lower cost with possibility of cost of 2 pair eyeglasses if  needed.  In meantime, likely presbyopia and to try OTC reading glasses.  2.  Headaches:  No concerning findings in history or exam.  To get eyes checked as well.  3.  Insomnia: Discussed good sleep hygiene.  Regular bedtime and waketime.  Decrease caffeine intake gradually, starting with coffee. No TV in bedroom.  To read if difficulty falling asleep.Calming activities before bed, but to get outside and be physically active.  4.  Likely UTI:  Ciprofloxacin 500 mg twice daily for 5 days.  Urine for culture.

## 2017-09-07 LAB — URINE CULTURE: Organism ID, Bacteria: NO GROWTH

## 2017-11-24 ENCOUNTER — Encounter: Payer: Self-pay | Admitting: Family Medicine

## 2017-11-24 ENCOUNTER — Ambulatory Visit: Payer: Self-pay | Attending: Family Medicine | Admitting: Family Medicine

## 2017-11-24 VITALS — BP 137/86 | HR 74 | Temp 98.2°F | Resp 18 | Ht 60.5 in | Wt 140.0 lb

## 2017-11-24 DIAGNOSIS — R0981 Nasal congestion: Secondary | ICD-10-CM | POA: Insufficient documentation

## 2017-11-24 DIAGNOSIS — R519 Headache, unspecified: Secondary | ICD-10-CM

## 2017-11-24 DIAGNOSIS — F419 Anxiety disorder, unspecified: Secondary | ICD-10-CM | POA: Insufficient documentation

## 2017-11-24 DIAGNOSIS — G4700 Insomnia, unspecified: Secondary | ICD-10-CM

## 2017-11-24 DIAGNOSIS — J309 Allergic rhinitis, unspecified: Secondary | ICD-10-CM

## 2017-11-24 DIAGNOSIS — H539 Unspecified visual disturbance: Secondary | ICD-10-CM

## 2017-11-24 DIAGNOSIS — G8929 Other chronic pain: Secondary | ICD-10-CM

## 2017-11-24 DIAGNOSIS — R51 Headache: Secondary | ICD-10-CM

## 2017-11-24 MED ORDER — LORATADINE 10 MG PO TABS: 10.0000 mg | ORAL_TABLET | Freq: Every day | ORAL | 11 refills | Status: DC

## 2017-11-24 NOTE — Patient Instructions (Signed)
?au ?â?u thông th???ng không ro? nguyên nhân  General Headache Without Cause  ?au ??u là ?au hay khó ch?u ?? xung quanh ??u ho?c khu v??c cô?. Nguyên nhân c? th? c?a ?au ??u có th? không ???c pha?t hiê?n. Có nhi?u nguyên nhân và lo?i ?au ??u. M?t vài loa?i ?au ?â?u phô? biê?n la?:  · ?au ??u c?ng th?ng.  · ?au n?a ??u.  · ?au ?â?u t??ng c?n.  · ?au ??u hàng ngày m?n tính.    Tuân th? nh?ng h??ng d?n này ? nhà:    Theo dõi tình tr?ng c?a quý v? ?? phát hi?n b?t k? thay ??i nào. Ti?n hành nh?ng b???c sau ?? giúp ca?i thiê?n ti?nh tra?ng cu?a quy? vi?:  X? trí ?au    · Ch? s? d?ng thu?c không kê ??n và thu?c kê ??n theo ch? d?n c?a chuyên gia ch?m sóc s?c kh?e.  · N?m trong m?t phòng t?i, yên t?nh khi quý v? b? ?au ??u.  · N?u ???c ch? d?n, ch??m ?á l?nh vào vùng ??u và c?:  ? Cho ?á l?nh vào túi ni lông.  ? ?? kh?n t?m ? gi?a da và túi ch??m.  ? Ch??m ?á l?nh trong 20 phút, 2–3 l?n m?i ngày.  · S? d?ng ??m no?ng ho?c t?m n??c nóng ?? t?ng nhi?t vào vùng ??u và c? theo ch? d?n c?a chuyên gia ch?m sóc s?c kh?e.  · Gi? cho ánh sáng d?u nh? n?u ánh sáng m?nh làm quý v? khó ch?u và làm ch?ng ?au ??u t?i t? h?n.  ?n và u?ng  · ?n u?ng theo l?ch bình th??ng.  · H?n ch? u?ng r??u.  · Gi?m l??ng cafein quy? vi? u?ng, ho?c ng?ng u?ng caffeine.  H??ng d?n chung  · Tuân th? t?t c? các l?n khám theo dõi theo ch? d?n c?a chuyên gia ch?m sóc s?c kh?e. ?i?u này có vai trò quan tr?ng.  · Ghi nh?t ký ?au ??u hàng ngày ?? giúp tìm ra ?i?u gì có th? gây các c?n ?au ??u. Ví d?, hãy ghi l?i:  ? Quý v? ?n và u?ng gì.  ? Quý v? ?ã ng? bao lâu.  ? B?t k? thay ??i nào trong ch? ?? ?n ho?c thu?c men.  · Th? k? thu?t xoa bóp ho?c các k? thu?t th? giãn khác.  · H?n ch? c?ng th?ng.  · Ng?i th?ng l?ng và không làm c?ng các c?.  · Không s? d?ng các s?n ph?m thu?c lá nào, bao g?m thu?c lá d?ng hút, thu?c lá d?ng nhai ho?c thu?c lá ?i?n t?. N?u quý v? c?n giúp ?? ?? cai thu?c, hãy h?i chuyên gia ch?m sóc s?c kh?e.   · T?p th? d?c th??ng xuyên theo ch? d?n c?a chuyên gia ch?m sóc s?c kh?e.  · Ng? theo m?t l?ch trình bi?nh th??ng. Ng? 7–9 ti?ng, ho?c th?i gian ng? theo khuy?n ngh? c?a chuyên gia ch?m sóc s?c kh?e.  Hãy liên l?c v?i chuyên gia ch?m sóc s?c kh?e n?u:  · Tri?u ch?ng c?a quý v? không c?i thi?n ???c b?ng thu?c.  · Quy? vi? b? ?au ??u khác v??i ?au ??u thông th??ng.  · Quý v? b? bu?n nôn ho?c quý v? nôn.  · Quý v? b? s?t.  Yêu c?u tr? giúp ngay l?p t?c n?u:  · Quý v? ?au ??u n?ng h?n.  · Quý v? liên t?c b? nôn m?a.  · Quý v? b? c?ng c?.  · Quý v? không nhìn   th?y gì.  · Quý v? b? khó nói.  · Quý v? b? ?au ? m?t ho?c tai.  · Quý v? b? y?u c? ho?c m?t ki?m soát c?.  · Quý v? m?t th?ng b?ng ho?c ?i l?i khó kh?n.  · Quý v? c?m th?y mu?n ng?t ho?c ng?t.  · Quý v? b? lú l?n.  Thông tin này không nh?m m?c ?ích thay th? cho l?i khuyên mà chuyên gia ch?m sóc s?c kh?e nói v?i quý v?. Hãy b?o ??m quý v? ph?i th?o lu?n b?t k? v?n ?? gì mà quý v? có v?i chuyên gia ch?m sóc s?c kh?e c?a quý v?.  Document Released: 06/02/2015 Document Revised: 05/24/2016 Document Reviewed: 06/03/2014  Elsevier Interactive Patient Education © 2018 Elsevier Inc.

## 2017-11-24 NOTE — Progress Notes (Signed)
Subjective:    Patient ID: Bonnie Stewart, female    DOB: [birth year], 47 y.o.   MRN: [id number]   Due to a language barrier, patient is accompanied by a live interpreter at today's visit.   HPI 47 yo female who was followed by Dr. Julieanne Manson at [facility] who presents to establish care.  Patient states that her main issues are recurrent headaches which in the past have generally been dull, aching headaches that involved her whole head.  Today however patient states that her recent headaches over the last few weeks have been dull, frontal headaches.  Patient states that her headache is about a 5 or 6 on a 0-to-10 scale.  Patient believes that her headaches have been occurring over the past year.  Patient denies any dizziness or focal numbness or weakness with the headaches.  Patient denies any light or noise sensitivity and no nausea with the headaches.  Patient states that she normally takes over-the-counter BC powders which relieve her headaches.  Patient believes that she has 4-5 headaches per month.  Patient has had some recent increase in nasal congestion and postnasal drainage.  No fever or chills.  Patient denies any stomach upset with the use of BC powders.  Patient has had no blood in the stool and no dark/sticky stools.      Patient also reports that she has had long-term issues with difficulty both falling asleep and staying asleep.  Patient admits to some anxiety but does not believe that she has any depression.  Patient does not believe that she needs medication for anxiety.  Patient states that she has tried keeping her bedroom dark and not using the bedroom for activities other than sleeping.  Patient has noticed increased difficulty with reading small print.  Patient does occasionally use over-the-counter reading glasses which do help with her vision.      Patient reports no significant past medical issues.  Patient states that she does not really know her family  history because her family is still back in [place].  Patient does not work outside of the home.  Patient is taking classes to learn [place].  Patient does not smoke.  Patient has 2 children who are 12 and 16.  Patient has had placement of an IUD.  Patient denies any surgical history.  Patient has had Pap smear and mammogram done last year.  Patient reports no history of abnormal Pap smears.   Review of Systems  Constitutional: Positive for fatigue (feels that this is related to poor sleep). Negative for appetite change, chills, diaphoresis, fever and unexpected weight change.  HENT: Positive for congestion, postnasal drip and rhinorrhea. Negative for facial swelling, sinus pressure, sinus pain, sore throat and trouble swallowing.   Eyes: Positive for visual disturbance. Negative for photophobia.  Respiratory: Negative for cough and shortness of breath.   Cardiovascular: Negative for chest pain, palpitations and leg swelling.  Gastrointestinal: Negative for abdominal pain, blood in stool and nausea.  Genitourinary: Negative for difficulty urinating, dysuria and flank pain.  Musculoskeletal: Negative for back pain, gait problem and joint swelling.  Neurological: Positive for headaches. Negative for dizziness.  Psychiatric/Behavioral: Positive for sleep disturbance. Negative for suicidal ideas. The patient is nervous/anxious.        Objective:   Physical Exam BP 137/86 (BP Location: Left Arm, Patient Position: Sitting, Cuff Size: Normal)   Pulse 74   Temp 98.2 F (36.8 C) (Oral)   Resp 18   Ht 5'  0.5" (1.537 m)   Wt 140 lb (63.5 kg)   SpO2 99%   BMI 26.89 kg/m vital signs and nurse's note reviewed at today's visit General-well-nourished, well-developed female in no acute distress HEENT- head is atraumatic/normocephalic, conjunctiva are within normal and extraocular movements are intact, no nystagmus.  TMs are dull bilaterally, nares with edema of the nasal turbinates with clear nasal  discharge, patient with mild posterior pharynx erythema Neck-supple, no lymphadenopathy, no carotid bruit, no thyromegaly Cardiovascular-regular rate and rhythm Lungs-clear to auscultation bilaterally Back-no CVA tenderness Abdomen-soft, nontender Extremities-no edema Neuro- cranial nerves II through XII are grossly intact      Assessment & Plan:  1. Chronic nonintractable headache, unspecified headache type Patient with complaint of chronic headaches for the past year.  Patient has some mild anxiety but she does not believe that this is contributing to her headaches.  Patient denies depression.  Patient does have some issues with decreased visual acuity which may be playing a role in her headaches.  Patient is being referred to an eye specialist.  Patient also has a sleep disturbance/insomnia which could also be contributing to headaches.  Patient additionally on exam with symptoms suggestive of allergic rhinitis which may also be contributing to her headaches.  Patient will be referred to optometry regarding her vision and patient agrees to take over-the-counter loratadine to help with her allergic rhinitis.  Patient is encouraged to use BC powders with caution as they can increase risk of gastritis and GI bleeding.  Patient may wish to switch to over-the-counter Excedrin or similar generic medication.  Patient should call or return if she has worsening of headaches or if headaches are not improving. - Basic Metabolic Panel  2. Visual disturbance Patient will have a BMP to check blood sugar and electrolytes secondary to headaches and visual disturbance.  Patient however likely has age-related visual changes.  Patient may continue the use of over-the-counter reading glasses and patient will be referred to optometry for further evaluation and treatment - Basic Metabolic Panel - Ambulatory referral to Optometry  3. Insomnia, unspecified type Patient with complaint of insomnia.  Sleep hygiene was  again reviewed with the patient.  With the help of the interpreter, patient was asked to obtain and take over-the-counter melatonin.  She may wish to start with 3 mg and then increase to 5 mg if needed.  Patient should not exceed 10 mg per night. - loratadine (CLARITIN) 10 MG tablet; Take 1 tablet (10 mg total) by mouth daily. To help with nasal congestion.  Dispense: 30 tablet; Refill: 11  4. Allergic rhinitis, unspecified seasonality, unspecified trigger Patient with complaint of frontal headache and also has complaint of nasal congestion along with findings suggestive of allergic rhinitis.  Patient will be placed on loratadine to take once daily as needed for congestion but patient should follow-up if she thinks that this is not effective for controlling her congestion and postnasal drainage or if she continues to have frontal headaches.  *Influenza immunization was offered at today's visit but declined by the patient  An After Visit Summary was printed and given to the patient.  Return in about 4 months (around 03/27/2018), or if symptoms worsen or fail to improve.

## 2017-11-25 LAB — BASIC METABOLIC PANEL WITH GFR
BUN/Creatinine Ratio: 16 (ref 9–23)
BUN: 10 mg/dL (ref 6–24)
CO2: 22 mmol/L (ref 20–29)
Calcium: 8.8 mg/dL (ref 8.7–10.2)
Chloride: 108 mmol/L — ABNORMAL HIGH (ref 96–106)
Creatinine, Ser: 0.62 mg/dL (ref 0.57–1.00)
GFR calc Af Amer: 125 mL/min/1.73
GFR calc non Af Amer: 108 mL/min/1.73
Glucose: 84 mg/dL (ref 65–99)
Potassium: 3.9 mmol/L (ref 3.5–5.2)
Sodium: 143 mmol/L (ref 134–144)

## 2017-11-30 ENCOUNTER — Telehealth: Payer: Self-pay | Admitting: *Deleted

## 2017-11-30 ENCOUNTER — Telehealth: Payer: Self-pay | Admitting: Family Medicine

## 2017-11-30 NOTE — Telephone Encounter (Signed)
MA unable to reach patient due to VM system not being set up. !!!Please inform patient of BMP being normal!!!

## 2017-11-30 NOTE — Telephone Encounter (Signed)
-----   Message from Cain Saupe, MD sent at 11/25/2017  8:52 AM EDT ----- Notify patient of normal BMP

## 2017-11-30 NOTE — Telephone Encounter (Signed)
I informed the patient of BMP being normal

## 2018-03-27 ENCOUNTER — Ambulatory Visit: Payer: No Typology Code available for payment source | Admitting: Family Medicine

## 2018-06-22 ENCOUNTER — Other Ambulatory Visit: Payer: Self-pay

## 2018-06-22 DIAGNOSIS — R5381 Other malaise: Secondary | ICD-10-CM

## 2018-06-28 ENCOUNTER — Other Ambulatory Visit (HOSPITAL_COMMUNITY): Payer: Self-pay | Admitting: *Deleted

## 2018-06-28 DIAGNOSIS — Z1231 Encounter for screening mammogram for malignant neoplasm of breast: Secondary | ICD-10-CM

## 2018-07-05 ENCOUNTER — Other Ambulatory Visit (HOSPITAL_COMMUNITY): Payer: Self-pay | Admitting: *Deleted

## 2018-07-05 DIAGNOSIS — Z1231 Encounter for screening mammogram for malignant neoplasm of breast: Secondary | ICD-10-CM

## 2018-08-11 ENCOUNTER — Other Ambulatory Visit: Payer: Self-pay | Admitting: Family Medicine

## 2018-08-11 DIAGNOSIS — R5381 Other malaise: Secondary | ICD-10-CM

## 2018-09-27 ENCOUNTER — Other Ambulatory Visit: Payer: Self-pay

## 2018-09-27 ENCOUNTER — Ambulatory Visit
Admission: RE | Admit: 2018-09-27 | Discharge: 2018-09-27 | Disposition: A | Discharge status: Home / Self Care | Payer: Medicaid Other | Source: Ambulatory Visit | Attending: Family Medicine | Admitting: Family Medicine

## 2018-09-27 DIAGNOSIS — R5381 Other malaise: Secondary | ICD-10-CM

## 2018-10-12 ENCOUNTER — Encounter: Payer: Self-pay | Admitting: *Deleted

## 2018-11-03 ENCOUNTER — Other Ambulatory Visit (HOSPITAL_COMMUNITY): Payer: Self-pay | Admitting: *Deleted

## 2018-11-03 DIAGNOSIS — Z1231 Encounter for screening mammogram for malignant neoplasm of breast: Secondary | ICD-10-CM

## 2018-11-07 ENCOUNTER — Ambulatory Visit (HOSPITAL_COMMUNITY): Payer: No Typology Code available for payment source

## 2018-11-24 ENCOUNTER — Ambulatory Visit: Payer: Medicaid Other | Admitting: Medical

## 2018-12-11 ENCOUNTER — Ambulatory Visit: Payer: Medicaid Other | Admitting: Obstetrics and Gynecology

## 2018-12-27 ENCOUNTER — Ambulatory Visit (INDEPENDENT_AMBULATORY_CARE_PROVIDER_SITE_OTHER): Payer: Medicaid Other | Admitting: Obstetrics

## 2018-12-27 ENCOUNTER — Other Ambulatory Visit: Payer: Self-pay

## 2018-12-27 ENCOUNTER — Encounter: Payer: Self-pay | Admitting: Obstetrics

## 2018-12-27 ENCOUNTER — Other Ambulatory Visit (HOSPITAL_COMMUNITY)
Admission: RE | Admit: 2018-12-27 | Discharge: 2018-12-27 | Disposition: A | Discharge status: Home / Self Care | Payer: Medicaid Other | Source: Ambulatory Visit | Attending: Obstetrics | Admitting: Obstetrics

## 2018-12-27 VITALS — BP 135/84 | HR 74 | Wt 141.3 lb

## 2018-12-27 DIAGNOSIS — Z01419 Encounter for gynecological examination (general) (routine) without abnormal findings: Secondary | ICD-10-CM | POA: Diagnosis not present

## 2018-12-27 DIAGNOSIS — N898 Other specified noninflammatory disorders of vagina: Secondary | ICD-10-CM

## 2018-12-27 DIAGNOSIS — Z889 Allergy status to unspecified drugs, medicaments and biological substances status: Secondary | ICD-10-CM

## 2018-12-27 DIAGNOSIS — Z0000 Encounter for general adult medical examination without abnormal findings: Secondary | ICD-10-CM | POA: Diagnosis not present

## 2018-12-27 DIAGNOSIS — M791 Myalgia, unspecified site: Secondary | ICD-10-CM

## 2018-12-27 DIAGNOSIS — Z780 Asymptomatic menopausal state: Secondary | ICD-10-CM

## 2018-12-27 MED ORDER — IBUPROFEN 800 MG PO TABS: 800.0000 mg | ORAL_TABLET | Freq: Three times a day (TID) | ORAL | 5 refills | Status: AC | PRN

## 2018-12-27 MED ORDER — LORATADINE 10 MG PO TABS: 10.0000 mg | ORAL_TABLET | Freq: Every day | ORAL | 11 refills | Status: AC
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
## 2018-12-27 NOTE — Progress Notes (Signed)
Patient is in the office for annual, last pap 07-13-16.  Last mammogram 09-27-18 IUD was removed 10-11-15, pt states that she no longer has menstrual cycles.

## 2018-12-28 LAB — CERVICOVAGINAL ANCILLARY ONLY
Bacterial Vaginitis (gardnerella): POSITIVE — AB
Candida Glabrata: NEGATIVE
Candida Vaginitis: NEGATIVE
Chlamydia: NEGATIVE
Comment: NEGATIVE
Comment: NEGATIVE
Comment: NEGATIVE
Comment: NEGATIVE
Comment: NEGATIVE
Comment: NORMAL
Neisseria Gonorrhea: NEGATIVE
Trichomonas: NEGATIVE

## 2019-01-01 ENCOUNTER — Other Ambulatory Visit: Payer: Self-pay | Admitting: Obstetrics

## 2019-01-01 DIAGNOSIS — B9689 Other specified bacterial agents as the cause of diseases classified elsewhere: Secondary | ICD-10-CM

## 2019-01-01 MED ORDER — TINIDAZOLE 500 MG PO TABS: 1000.0000 mg | ORAL_TABLET | Freq: Every day | ORAL | 2 refills | Status: DC

## 2019-01-03 LAB — CYTOLOGY - PAP
Comment: NEGATIVE
Diagnosis: NEGATIVE
High risk HPV: NEGATIVE

## 2019-07-25 ENCOUNTER — Other Ambulatory Visit: Payer: Self-pay | Admitting: Family Medicine

## 2019-07-25 DIAGNOSIS — Z1231 Encounter for screening mammogram for malignant neoplasm of breast: Secondary | ICD-10-CM

## 2019-08-31 ENCOUNTER — Emergency Department (HOSPITAL_COMMUNITY)
Admission: EM | Admit: 2019-08-31 | Discharge: 2019-08-31 | Disposition: A | Discharge status: Home / Self Care | Payer: No Typology Code available for payment source | Attending: Emergency Medicine | Admitting: Emergency Medicine

## 2019-08-31 ENCOUNTER — Other Ambulatory Visit: Payer: Self-pay

## 2019-08-31 ENCOUNTER — Emergency Department (HOSPITAL_COMMUNITY): Payer: No Typology Code available for payment source

## 2019-08-31 ENCOUNTER — Encounter (HOSPITAL_COMMUNITY): Payer: Self-pay

## 2019-08-31 DIAGNOSIS — Z7982 Long term (current) use of aspirin: Secondary | ICD-10-CM | POA: Insufficient documentation

## 2019-08-31 DIAGNOSIS — R0789 Other chest pain: Secondary | ICD-10-CM

## 2019-08-31 NOTE — Discharge Instructions (Signed)
I recommend a combination of tylenol and ibuprofen for management of your pain. You can take a low dose of both at the same time. I recommend 325 mg of Tylenol combined with 400 mg of ibuprofen. This is one regular Tylenol and two regular ibuprofen. You can take these 2-3 times for day for your pain. Please try to take these medications with a small amount of food as well to prevent upsetting your stomach.  Also, please consider topical pain relieving creams such as Voltaran Gel, BioFreeze, or Icy Hot. There is also a pain relieving cream made by [employer]. You should be able to find all of these at your local pharmacy.   If your symptoms worsen, please return to the emergency department.  If they do not improve in 1 week, please call your primary care provider to schedule follow-up appointment.  It was a pleasure to meet you.
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
## 2019-09-04 ENCOUNTER — Emergency Department (HOSPITAL_COMMUNITY)
Admission: EM | Admit: 2019-09-04 | Discharge: 2019-09-04 | Disposition: A | Discharge status: Home / Self Care | Payer: No Typology Code available for payment source | Attending: Emergency Medicine | Admitting: Emergency Medicine

## 2019-09-04 ENCOUNTER — Other Ambulatory Visit: Payer: Self-pay

## 2019-09-04 ENCOUNTER — Encounter (HOSPITAL_COMMUNITY): Payer: Self-pay | Admitting: *Deleted

## 2019-09-04 DIAGNOSIS — S39012A Strain of muscle, fascia and tendon of lower back, initial encounter: Secondary | ICD-10-CM | POA: Insufficient documentation

## 2019-09-04 DIAGNOSIS — Y939 Activity, unspecified: Secondary | ICD-10-CM | POA: Insufficient documentation

## 2019-09-04 DIAGNOSIS — Z7982 Long term (current) use of aspirin: Secondary | ICD-10-CM | POA: Insufficient documentation

## 2019-09-04 DIAGNOSIS — Y929 Unspecified place or not applicable: Secondary | ICD-10-CM | POA: Insufficient documentation

## 2019-09-04 DIAGNOSIS — Y999 Unspecified external cause status: Secondary | ICD-10-CM | POA: Insufficient documentation

## 2019-09-04 DIAGNOSIS — S161XXA Strain of muscle, fascia and tendon at neck level, initial encounter: Secondary | ICD-10-CM

## 2019-09-04 MED ORDER — ACETAMINOPHEN ER 650 MG PO TBCR: 650.0000 mg | EXTENDED_RELEASE_TABLET | Freq: Three times a day (TID) | ORAL | 0 refills | Status: AC | PRN

## 2019-09-04 MED ORDER — METHOCARBAMOL 500 MG PO TABS: 500.0000 mg | ORAL_TABLET | Freq: Two times a day (BID) | ORAL | 0 refills | Status: DC

## 2019-09-04 MED ORDER — LIDOCAINE 4 % EX PTCH: 1.0000 | MEDICATED_PATCH | Freq: Two times a day (BID) | CUTANEOUS | 0 refills | Status: DC
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
## 2019-09-28 ENCOUNTER — Ambulatory Visit
Admission: RE | Admit: 2019-09-28 | Discharge: 2019-09-28 | Disposition: A | Discharge status: Home / Self Care | Payer: Medicaid Other | Source: Ambulatory Visit | Attending: Family Medicine | Admitting: Family Medicine

## 2019-09-28 ENCOUNTER — Other Ambulatory Visit: Payer: Self-pay

## 2019-09-28 DIAGNOSIS — Z1231 Encounter for screening mammogram for malignant neoplasm of breast: Secondary | ICD-10-CM

## 2019-12-10 ENCOUNTER — Ambulatory Visit: Payer: Medicaid Other | Admitting: Internal Medicine

## 2019-12-11 ENCOUNTER — Encounter: Payer: Self-pay | Admitting: Family Medicine

## 2019-12-11 ENCOUNTER — Other Ambulatory Visit: Payer: Self-pay

## 2019-12-11 ENCOUNTER — Ambulatory Visit (INDEPENDENT_AMBULATORY_CARE_PROVIDER_SITE_OTHER): Payer: Medicaid Other | Admitting: Family Medicine

## 2019-12-11 VITALS — BP 114/74 | HR 78 | Temp 97.2°F | Ht 63.0 in | Wt 140.0 lb

## 2019-12-11 DIAGNOSIS — Z23 Encounter for immunization: Secondary | ICD-10-CM | POA: Diagnosis not present

## 2019-12-11 DIAGNOSIS — Z30432 Encounter for removal of intrauterine contraceptive device: Secondary | ICD-10-CM

## 2019-12-11 DIAGNOSIS — J3089 Other allergic rhinitis: Secondary | ICD-10-CM | POA: Diagnosis not present

## 2019-12-11 DIAGNOSIS — Z789 Other specified health status: Secondary | ICD-10-CM

## 2019-12-11 NOTE — Progress Notes (Signed)
Subjective:  Patient ID: Bonnie Stewart, female    DOB: 1970-10-18  Age: 49 y.o. MRN: [id number]  CC:  Chief Complaint  Patient presents with  . New Patient (Initial Visit)    here to establish, no complaints      HPI  HPI Ms Bonnie Stewart is a 49 year old [place] female patient who presents today to establish care.  Has not been seen in many years by primary care provider.  She has no complaints today.  She reports that she has an IUD that is been in for many years and she has no cycles with her IUD does need to get Pap smear and IUD removed.  Referral to GYN as she is willing.  She would like to have her flu shot today.  She denies having any trouble sleeping, trouble chewing or swallowing, going to the bathroom no blood in urine or stool.  Denies have any falls or injuries.  Denies having any trouble with hearing or vision.  Denies having any chest pain, cough, shortness of breath, fever, chills, headaches, vision changes or dizziness.  Today patient denies signs and symptoms of COVID 19 infection including fever, chills, cough, shortness of breath, and headache. Past Medical, Surgical, Social History, Allergies, and Medications have been Reviewed.   History reviewed. No pertinent past medical history.  Current Meds  Medication Sig  . acetaminophen (TYLENOL 8 HOUR) 650 MG CR tablet Take 1 tablet (650 mg total) by mouth every 8 (eight) hours as needed.  . Aspirin-Salicylamide-Caffeine (ARTHRITIS STRENGTH BC POWDER PO) Take 1 tablet by mouth as needed.  Marland Kitchen ibuprofen (ADVIL) 800 MG tablet Take 1 tablet (800 mg total) by mouth every 8 (eight) hours as needed.  Marland Kitchen levonorgestrel (MIRENA) 20 MCG/24HR IUD 1 each by Intrauterine route once.  . loratadine (CLARITIN) 10 MG tablet Take 1 tablet (10 mg total) by mouth daily. To help with nasal congestion.    ROS:  Review of Systems  Constitutional: Negative.   HENT: Negative.   Eyes: Negative.   Respiratory: Negative.   Cardiovascular:  Negative.   Gastrointestinal: Negative.   Genitourinary: Negative.   Musculoskeletal: Negative.   Skin: Negative.   Neurological: Negative.   Endo/Heme/Allergies: Negative.   Psychiatric/Behavioral: Negative.      Objective:   Today's Vitals: BP 114/74 (BP Location: Right Arm, Patient Position: Sitting, Cuff Size: Normal)   Pulse 78   Temp (!) 97.2 F (36.2 C) (Tympanic)   Ht 5\' 3"  (1.6 m)   Wt 140 lb (63.5 kg)   LMP  (LMP Unknown)   SpO2 99%   BMI 24.80 kg/m  Vitals with BMI 12/11/2019 09/04/2019 08/31/2019  Height 5\' 3"  5\' 3"  -  Weight 140 lbs 140 lbs -  BMI 24.81 24.8 -  Systolic 114 151 11/01/2019  Diastolic 74 93 76  Pulse 78 74 72     Physical Exam Vitals and nursing note reviewed.  Constitutional:      Appearance: Normal appearance. She is well-developed, well-groomed and normal weight.  HENT:     Head: Normocephalic and atraumatic.     Right Ear: External ear normal.     Left Ear: External ear normal.     Mouth/Throat:     Comments: Mask in place  Eyes:     General:        Right eye: No discharge.        Left eye: No discharge.     Conjunctiva/sclera: Conjunctivae normal.  Cardiovascular:  Rate and Rhythm: Normal rate and regular rhythm.     Pulses: Normal pulses.     Heart sounds: Normal heart sounds.  Pulmonary:     Effort: Pulmonary effort is normal.     Breath sounds: Normal breath sounds.  Musculoskeletal:        General: Normal range of motion.     Cervical back: Normal range of motion and neck supple.  Skin:    General: Skin is warm.  Neurological:     General: No focal deficit present.     Mental Status: She is alert and oriented to person, place, and time.  Psychiatric:        Attention and Perception: Attention normal.        Mood and Affect: Mood normal.        Speech: Speech normal.        Behavior: Behavior normal. Behavior is cooperative.        Thought Content: Thought content normal.        Cognition and Memory: Cognition normal.         Judgment: Judgment normal.     Assessment   1. Environmental and seasonal allergies   2. Encounter for IUD removal   3. Language barrier   4. Need for immunization against influenza     Tests ordered Orders Placed This Encounter  Procedures  . Flu Vaccine QUAD 36+ mos IM  . Ambulatory referral to Obstetrics / Gynecology     Plan: Please see assessment and plan per problem list above.   No orders of the defined types were placed in this encounter.   Patient to follow-up in 7 months for CPE .  Note: This dictation was prepared with Dragon dictation along with smaller phrase technology. Similar sounding words can be transcribed inadequately or may not be corrected upon review. Any transcriptional errors that result from this process are unintentional.      Freddy Finner, NP

## 2019-12-11 NOTE — Assessment & Plan Note (Signed)
Continue Claritin as needed, currently controlled

## 2019-12-11 NOTE — Assessment & Plan Note (Signed)
Preferred language is [place] but she is taking classes to help with english understanding.

## 2019-12-11 NOTE — Patient Instructions (Addendum)
  HAPPY FALL!  I appreciate the opportunity to provide you with care for your health and wellness. Today we discussed: established care   Follow up: 7 months for CPE- morning appt fasting   No labs Referrals today- GYN for IUD removal and pap smear needs Flu shot today  Nice to meet you today :)  Please continue to practice social distancing to keep you, your family, and our community safe.  If you must go out, please wear a mask and practice good handwashing.  It was a pleasure to see you and I look forward to continuing to work together on your health and well-being. Please do not hesitate to call the office if you need care or have questions about your care.  Have a wonderful day and week. With Gratitude, Tereasa Coop, DNP, AGNP-BC

## 2019-12-11 NOTE — Assessment & Plan Note (Signed)
Reports having her IUD for many years.  She has not been seen in a while per her.  She also is in need of a papsmear. Referral made

## 2019-12-11 NOTE — Assessment & Plan Note (Signed)

## 2019-12-20 ENCOUNTER — Ambulatory Visit (INDEPENDENT_AMBULATORY_CARE_PROVIDER_SITE_OTHER): Payer: Medicaid Other | Admitting: Advanced Practice Midwife

## 2019-12-20 ENCOUNTER — Other Ambulatory Visit: Payer: Self-pay

## 2019-12-20 ENCOUNTER — Encounter: Payer: Self-pay | Admitting: Advanced Practice Midwife

## 2019-12-20 DIAGNOSIS — Z30432 Encounter for removal of intrauterine contraceptive device: Secondary | ICD-10-CM

## 2019-12-20 NOTE — Assessment & Plan Note (Signed)
IUD Removed  12/20/2019

## 2019-12-20 NOTE — Patient Instructions (Signed)

## 2019-12-20 NOTE — Progress Notes (Signed)
    GYNECOLOGY OFFICE PROCEDURE NOTE  Bonnie Stewart is a 49 y.o. [id number] here for IUD removal No GYN concerns.  Last pap smear was on 12/27/2018 and was normal.  IUD Removal and Reinsertion  Patient identified, informed consent performed, consent signed.   Discussed risks of irregular bleeding, cramping, infection, malpositioning or misplacement of the IUD outside the uterus which may require further procedures. Also discussed >99% contraception efficacy, increased risk of ectopic pregnancy with failure of method.   Emphasized that this did not protect against STIs, condoms recommended during all sexual encounters.Advised to use backup contraception for one week as the risk of pregnancy is higher during the transition period of removing an IUD and replacing it with another one. Time out was performed. Speculum placed in the vagina. The strings of the IUD were grasped and pulled using ring forceps. The IUD was successfully removed in its entirety. Good hemostasis noted. Patient tolerated procedure well.    Clayton Bibles, MSN, CNM Certified Nurse Midwife, [employer] for [employer], [facility] 12/20/19 2:10 PM

## 2020-07-10 ENCOUNTER — Encounter: Payer: Medicaid Other | Admitting: Nurse Practitioner

## 2020-10-01 ENCOUNTER — Other Ambulatory Visit: Payer: Self-pay | Admitting: Family Medicine

## 2020-10-01 DIAGNOSIS — Z1231 Encounter for screening mammogram for malignant neoplasm of breast: Secondary | ICD-10-CM

## 2020-10-06 ENCOUNTER — Other Ambulatory Visit: Payer: Self-pay

## 2020-10-06 ENCOUNTER — Ambulatory Visit
Admission: RE | Admit: 2020-10-06 | Discharge: 2020-10-06 | Disposition: A | Discharge status: Home / Self Care | Payer: Medicaid Other | Source: Ambulatory Visit | Attending: Family Medicine | Admitting: Family Medicine

## 2020-10-06 DIAGNOSIS — Z1231 Encounter for screening mammogram for malignant neoplasm of breast: Secondary | ICD-10-CM

## 2021-03-28 ENCOUNTER — Emergency Department (HOSPITAL_COMMUNITY)
Admission: EM | Admit: 2021-03-28 | Discharge: 2021-03-28 | Disposition: A | Discharge status: Home / Self Care | Payer: Medicaid Other | Attending: Emergency Medicine | Admitting: Emergency Medicine

## 2021-03-28 ENCOUNTER — Other Ambulatory Visit: Payer: Self-pay

## 2021-03-28 ENCOUNTER — Emergency Department (HOSPITAL_COMMUNITY): Payer: Medicaid Other

## 2021-03-28 ENCOUNTER — Encounter (HOSPITAL_COMMUNITY): Payer: Self-pay

## 2021-03-28 DIAGNOSIS — R519 Headache, unspecified: Secondary | ICD-10-CM | POA: Diagnosis present

## 2021-03-28 DIAGNOSIS — G510 Bell's palsy: Secondary | ICD-10-CM | POA: Insufficient documentation

## 2021-03-28 DIAGNOSIS — R200 Anesthesia of skin: Secondary | ICD-10-CM | POA: Insufficient documentation

## 2021-03-28 LAB — CBC WITH DIFFERENTIAL/PLATELET
Abs Immature Granulocytes: 0.01 10*3/uL (ref 0.00–0.07)
Basophils Absolute: 0 10*3/uL (ref 0.0–0.1)
Basophils Relative: 1 %
Eosinophils Absolute: 0.3 10*3/uL (ref 0.0–0.5)
Eosinophils Relative: 4 %
HCT: 43.8 % (ref 36.0–46.0)
Hemoglobin: 14.1 g/dL (ref 12.0–15.0)
Immature Granulocytes: 0 %
Lymphocytes Relative: 30 %
Lymphs Abs: 1.8 10*3/uL (ref 0.7–4.0)
MCH: 28.4 pg (ref 26.0–34.0)
MCHC: 32.2 g/dL (ref 30.0–36.0)
MCV: 88.3 fL (ref 80.0–100.0)
Monocytes Absolute: 0.7 10*3/uL (ref 0.1–1.0)
Monocytes Relative: 11 %
Neutro Abs: 3.3 10*3/uL (ref 1.7–7.7)
Neutrophils Relative %: 54 %
Platelets: 356 10*3/uL (ref 150–400)
RBC: 4.96 MIL/uL (ref 3.87–5.11)
RDW: 12 % (ref 11.5–15.5)
WBC: 6.1 10*3/uL (ref 4.0–10.5)
nRBC: 0 % (ref 0.0–0.2)

## 2021-03-28 LAB — BASIC METABOLIC PANEL
Anion gap: 7 (ref 5–15)
BUN: 16 mg/dL (ref 6–20)
CO2: 28 mmol/L (ref 22–32)
Calcium: 9.2 mg/dL (ref 8.9–10.3)
Chloride: 102 mmol/L (ref 98–111)
Creatinine, Ser: 0.57 mg/dL (ref 0.44–1.00)
GFR, Estimated: >60 mL/min (ref >60)
Glucose, Bld: 110 mg/dL — ABNORMAL HIGH (ref 70–99)
Potassium: 3.5 mmol/L (ref 3.5–5.1)
Sodium: 137 mmol/L (ref 135–145)

## 2021-03-28 MED ORDER — PREDNISONE 10 MG PO TABS: 20.0000 mg | ORAL_TABLET | Freq: Every day | ORAL | 0 refills | Status: AC

## 2021-03-28 MED ORDER — ACYCLOVIR 400 MG PO TABS: 400.0000 mg | ORAL_TABLET | Freq: Four times a day (QID) | ORAL | 0 refills | Status: AC

## 2021-03-28 NOTE — ED Provider Triage Note (Signed)
Emergency Medicine Provider Triage Evaluation Note  Bonnie Stewart , a 51 y.o. female  was evaluated in triage.  Pt complains of facial numbness and left-sided facial droop.  Facial numbness and headache have been ongoing for 2 days, however the facial droop was noted at approximately 4 hours prior to arrival.  Symptoms are limited to her face.  She denies extremity weakness, fatigue or numbness tingling.  She denies recent illness.  No treatment prior to arrival.  No history of strokes..  Review of Systems  Positive: Left-sided facial droop, numbness, tingling Negative: As above  Physical Exam  BP (!) 151/105 (BP Location: Right Arm)    Pulse 81    Temp 98.4 F (36.9 C) (Oral)    Resp 17    Ht 5\' 3"  (1.6 m)    Wt 63.5 kg    SpO2 96%    BMI 24.80 kg/m  Gen:   Awake, no distress   Resp:  Normal effort  MSK:   Moves extremities without difficulty  Other:  Patient with left-sided facial droop that includes left eyebrow.  Extraocular muscles intact.  Cranial nerve XI intact.  Sensation of the bilateral upper and lower extremities intact.  5/5 grip strength bilaterally.  Patient ambulating without difficulty  Ears without evidence of lesions or vesicles  Medical Decision Making  Medically screening exam initiated at 8:02 PM.  Appropriate orders placed.  Bonnie Stewart was informed that the remainder of the evaluation will be completed by another provider, this initial triage assessment does not replace that evaluation, and the importance of remaining in the ED until their evaluation is complete.  Suspect Bell's palsy, however will obtain CT head without contrast out of abundance of caution   Donalda Ewings, PA-C 03/28/21 2005

## 2021-03-28 NOTE — ED Triage Notes (Signed)
Patient says she has a left sided headache, down to her jaw, started around 3pm today. Patient has facial droop to the right side. No slurred speech. Same grip strength on both arms and legs. Able to feel the same on both sides. Able to hold arms and legs up for 10 seconds. Numbness and tingling around mouth for a couple days.

## 2021-03-28 NOTE — ED Provider Notes (Signed)
°  Face-to-face evaluation   History: She presents for evaluation of headache and facial asymmetry for 2 days.  She is with her husband who helps to states that the left side of her face appears weak.  She denies change in taste of food.  She denies upper or lower extremity problems including grip strength and walking.  She denies blurred vision.  No prior similar problem.  Physical exam: Alert, calm, cooperative.  Left facial weakness, mild including forehead and drooping left nasal labial fold.  No altered facial sensation.  MDM: Evaluation for  Chief Complaint  Patient presents with   Headache   Numbness     Patient presenting signs symptoms of Bell's palsy without signs or symptoms of CVA.  Screening CT ordered  to look for occult CNS abnormality.  I suspect that she can be treated symptomatically, and expectantly with prednisone and acyclovir.  Medical screening examination/treatment/procedure(s) were conducted as a shared visit with non-physician practitioner(s) and myself.  I personally evaluated the patient during the encounter    Mancel Bale, MD 03/29/21 [phone]

## 2021-03-28 NOTE — Discharge Instructions (Signed)
It appears that you have Bell's palsy which is also called a facial nerve paralysis.  We are treating you with to medicine to prevent problems.  Pick these prescriptions up from your pharmacy tomorrow morning.  Follow-up with your doctor in a week or 2 for checkup.  If you have irritation of the left eye, use an eyedrop such as Lacri-Lube to help keep it moist.  Return here, if needed for problems.

## 2021-03-29 NOTE — ED Provider Notes (Signed)
[facility]  HOSPITAL-EMERGENCY DEPT Provider Note   CSN: [id number] Arrival date & time: 03/28/21  7342     History  Chief Complaint  Patient presents with   Headache   Numbness    Bonnie Stewart is a 51 y.o. female.  HPI She presents for evaluation of headache and facial asymmetry for 2 days.  She is with her husband who helps to states that the left side of her face appears weak.  She denies change in taste of food.  She denies upper or lower extremity problems including grip strength and walking.  She denies blurred vision.  No prior similar problem.    Home Medications Prior to Admission medications   Medication Sig Start Date End Date Taking? Authorizing Provider  acyclovir (ZOVIRAX) 400 MG tablet Take 1 tablet (400 mg total) by mouth 4 (four) times daily. 03/28/21  Yes Mancel Bale, MD  predniSONE (DELTASONE) 10 MG tablet Take 2 tablets (20 mg total) by mouth daily. 03/28/21  Yes Mancel Bale, MD  acetaminophen (TYLENOL 8 HOUR) 650 MG CR tablet Take 1 tablet (650 mg total) by mouth every 8 (eight) hours as needed. Patient not taking: Reported on 12/20/2019 09/04/19   Derwood Kaplan, MD  Aspirin-Salicylamide-Caffeine (ARTHRITIS STRENGTH BC POWDER PO) Take 1 tablet by mouth as needed. Patient not taking: Reported on 12/20/2019    [provider]  ibuprofen (ADVIL) 800 MG tablet Take 1 tablet (800 mg total) by mouth every 8 (eight) hours as needed. Patient not taking: Reported on 12/20/2019 12/27/18   Brock Bad, MD  levonorgestrel [facility]) 20 MCG/24HR IUD 1 each by Intrauterine route once. Patient not taking: Reported on 12/20/2019    [provider]  loratadine (CLARITIN) 10 MG tablet Take 1 tablet (10 mg total) by mouth daily. To help with nasal congestion. Patient not taking: Reported on 12/20/2019 12/27/18   Brock Bad, MD      Allergies    Patient has no known allergies.    Review of Systems   Review of Systems  Physical  Exam Updated Vital Signs BP (!) 142/85    Pulse 74    Temp 98.4 F (36.9 C) (Oral)    Resp 17    Ht 5\' 3"  (1.6 m)    Wt 63.5 kg    SpO2 98%    BMI 24.80 kg/m  Physical Exam Vitals and nursing note reviewed.  Constitutional:      Appearance: She is well-developed. She is not ill-appearing.  HENT:     Head: Normocephalic and atraumatic.     Right Ear: Tympanic membrane, ear canal and external ear normal.     Left Ear: Tympanic membrane, ear canal and external ear normal.  Eyes:     Conjunctiva/sclera: Conjunctivae normal.     Pupils: Pupils are equal, round, and reactive to light.  Neck:     Trachea: Phonation normal.  Cardiovascular:     Rate and Rhythm: Normal rate.  Pulmonary:     Effort: Pulmonary effort is normal.  Abdominal:     Tenderness: There is no abdominal tenderness.  Musculoskeletal:        General: Normal range of motion.     Cervical back: Normal range of motion and neck supple.  Skin:    General: Skin is warm and dry.  Neurological:     Mental Status: She is alert and oriented to person, place, and time.     Sensory: No sensory deficit.  Motor: No abnormal muscle tone.     Coordination: Coordination normal.     Comments: No dysarthria, aphasia or ataxia.  Walks with normal gait.  Mild depression with left nasal labial fold, and forehead weakness.  Normal strength arms and legs bilaterally.  No altered touch sensation.  Psychiatric:        Mood and Affect: Mood normal.        Behavior: Behavior normal.        Thought Content: Thought content normal.        Judgment: Judgment normal.    ED Results / Procedures / Treatments   Labs (all labs ordered are listed, but only abnormal results are displayed) Labs Reviewed  BASIC METABOLIC PANEL - Abnormal; Notable for the following components:      Result Value   Glucose, Bld 110 (*)    All other components within normal limits  CBC WITH DIFFERENTIAL/PLATELET    EKG None  Radiology CT Head Wo  Contrast  Result Date: 03/28/2021 CLINICAL DATA:  51 year old female with facial droop and headache today. Initial encounter. EXAM: CT HEAD WITHOUT CONTRAST TECHNIQUE: Contiguous axial images were obtained from the base of the skull through the vertex without intravenous contrast. RADIATION DOSE REDUCTION: This exam was performed according to the departmental dose-optimization program which includes automated exposure control, adjustment of the mA and/or kV according to patient size and/or use of iterative reconstruction technique. COMPARISON:  None. FINDINGS: Brain: No evidence of acute infarction, hemorrhage, hydrocephalus, extra-axial collection or mass lesion/mass effect. Vascular: No hyperdense vessel or unexpected calcification. Skull: Normal. Negative for fracture or focal lesion. Sinuses/Orbits: No acute finding. Other: None. IMPRESSION: Unremarkable noncontrast head CT. Electronically Signed   By: Harmon Pier M.D.   On: 03/28/2021 20:09    Procedures Procedures    Medications Ordered in ED Medications - No data to display  ED Course/ Medical Decision Making/ A&P                           Medical Decision Making Patient presenting for subacute headache, and facial weakness, without known trauma.  No prior shoulder problem.  She is unable to describe altered taste sensation.  Problems Addressed: Bell's palsy: undiagnosed new problem with uncertain prognosis  Amount and/or Complexity of Data Reviewed Radiology: ordered and independent interpretation performed.    Details: CT head-no acute intracranial abnormality.  Risk Prescription drug management. Decision regarding hospitalization. Risk Details: Patient presenting with headache and facial weakness, without worrisome findings on exam.  No evidence for Ramsay Hunt syndrome.  Symptoms are mild and limited to left facial weakness.  This evaluation is consistent with Bell's palsy.  Doubt CVA, acute intracranial tumor, spinal disorder or  progressive neuropathy.  No requirement for further ED intervention or hospitalization at this time.  Patient prescribed acyclovir, and prednisone to decrease symptoms and improve chances of recovery.  Discharged with husband in stable condition           Final Clinical Impression(s) / ED Diagnoses Final diagnoses:  Bell's palsy    Rx / DC Orders ED Discharge Orders          Ordered    acyclovir (ZOVIRAX) 400 MG tablet  4 times daily        03/28/21 2129    predniSONE (DELTASONE) 10 MG tablet  Daily        03/28/21 2129  Mancel Bale, MD 03/29/21 1002

## 2021-09-23 ENCOUNTER — Other Ambulatory Visit: Payer: Self-pay | Admitting: Family Medicine

## 2021-09-23 DIAGNOSIS — Z1231 Encounter for screening mammogram for malignant neoplasm of breast: Secondary | ICD-10-CM

## 2021-10-15 ENCOUNTER — Ambulatory Visit
Admission: RE | Admit: 2021-10-15 | Discharge: 2021-10-15 | Disposition: A | Discharge status: Home / Self Care | Payer: Medicaid Other | Source: Ambulatory Visit | Attending: Family Medicine | Admitting: Family Medicine

## 2021-10-15 DIAGNOSIS — Z1231 Encounter for screening mammogram for malignant neoplasm of breast: Secondary | ICD-10-CM

## 2022-01-03 ENCOUNTER — Emergency Department (HOSPITAL_COMMUNITY): Payer: Medicaid Other

## 2022-01-03 ENCOUNTER — Encounter (HOSPITAL_COMMUNITY): Payer: Self-pay

## 2022-01-03 ENCOUNTER — Emergency Department (HOSPITAL_COMMUNITY)
Admission: EM | Admit: 2022-01-03 | Discharge: 2022-01-04 | Discharge status: Against Medical Advice | Payer: Medicaid Other | Attending: Emergency Medicine | Admitting: Emergency Medicine

## 2022-01-03 ENCOUNTER — Other Ambulatory Visit: Payer: Self-pay

## 2022-01-03 DIAGNOSIS — Z1152 Encounter for screening for COVID-19: Secondary | ICD-10-CM | POA: Insufficient documentation

## 2022-01-03 DIAGNOSIS — R519 Headache, unspecified: Secondary | ICD-10-CM | POA: Diagnosis present

## 2022-01-03 DIAGNOSIS — G43809 Other migraine, not intractable, without status migrainosus: Secondary | ICD-10-CM | POA: Insufficient documentation

## 2022-01-03 DIAGNOSIS — Z5321 Procedure and treatment not carried out due to patient leaving prior to being seen by health care provider: Secondary | ICD-10-CM | POA: Insufficient documentation

## 2022-01-03 DIAGNOSIS — H53149 Visual discomfort, unspecified: Secondary | ICD-10-CM | POA: Diagnosis not present

## 2022-01-03 DIAGNOSIS — M79672 Pain in left foot: Secondary | ICD-10-CM | POA: Insufficient documentation

## 2022-01-03 NOTE — ED Provider Triage Note (Signed)
Emergency Medicine Provider Triage Evaluation Note  Bonnie Stewart , a 51 y.o. female  was evaluated in triage.  Pt complains of headache for several days, as well as intermittent headaches for months. Hx of headaches in the past but this feels different. Some sensitivity to light. Has been taking tylenol and motrin without relief. Family member had similar symptoms before passing away a long time ago. Also been seeing a doctor for L foot pain.   Review of Systems  Positive: Headache, mild photophobia Negative: Nausea, vomiting, congestion, cough, fever  Physical Exam  BP 131/86 (BP Location: Left Arm)   Pulse 73   Temp 98.2 F (36.8 C) (Oral)   Resp 16   Ht 5\' 2"  (1.575 m)   Wt 63.5 kg   SpO2 100%   BMI 25.61 kg/m  Gen:   Awake, no distress   Resp:  Normal effort  MSK:   Moves extremities without difficulty  Other:    Medical Decision Making  Medically screening exam initiated at 5:42 PM.  Appropriate orders placed.  Bonnie Stewart was informed that the remainder of the evaluation will be completed by another provider, this initial triage assessment does not replace that evaluation, and the importance of remaining in the ED until their evaluation is complete.  Workup initiated   Donalda Ewings, PA-C 01/03/22 1744

## 2022-01-03 NOTE — ED Triage Notes (Signed)
Pt to er, sig other with pt, states that they are here because she has been having headaches for the past few months; however, they have been worse recently.

## 2022-01-04 ENCOUNTER — Other Ambulatory Visit: Payer: Self-pay

## 2022-01-04 ENCOUNTER — Emergency Department (HOSPITAL_COMMUNITY)
Admission: EM | Admit: 2022-01-04 | Discharge: 2022-01-04 | Disposition: A | Discharge status: Home / Self Care | Payer: Medicaid Other | Source: Home / Self Care | Attending: Emergency Medicine | Admitting: Emergency Medicine

## 2022-01-04 ENCOUNTER — Encounter (HOSPITAL_COMMUNITY): Payer: Self-pay

## 2022-01-04 DIAGNOSIS — G43809 Other migraine, not intractable, without status migrainosus: Secondary | ICD-10-CM | POA: Insufficient documentation

## 2022-01-04 DIAGNOSIS — Z1152 Encounter for screening for COVID-19: Secondary | ICD-10-CM | POA: Insufficient documentation

## 2022-01-04 HISTORY — DX: Pure hypercholesterolemia, unspecified: E78.00

## 2022-01-04 LAB — CBC WITH DIFFERENTIAL/PLATELET
Abs Immature Granulocytes: 0 10*3/uL (ref 0.00–0.07)
Basophils Absolute: 0 10*3/uL (ref 0.0–0.1)
Basophils Relative: 1 %
Eosinophils Absolute: 0.1 10*3/uL (ref 0.0–0.5)
Eosinophils Relative: 3 %
HCT: 38.1 % (ref 36.0–46.0)
Hemoglobin: 12.2 g/dL (ref 12.0–15.0)
Immature Granulocytes: 0 %
Lymphocytes Relative: 37 %
Lymphs Abs: 1.4 10*3/uL (ref 0.7–4.0)
MCH: 28.3 pg (ref 26.0–34.0)
MCHC: 32 g/dL (ref 30.0–36.0)
MCV: 88.4 fL (ref 80.0–100.0)
Monocytes Absolute: 0.3 10*3/uL (ref 0.1–1.0)
Monocytes Relative: 9 %
Neutro Abs: 1.9 10*3/uL (ref 1.7–7.7)
Neutrophils Relative %: 50 %
Platelets: 286 10*3/uL (ref 150–400)
RBC: 4.31 MIL/uL (ref 3.87–5.11)
RDW: 11.8 % (ref 11.5–15.5)
WBC: 3.7 10*3/uL — ABNORMAL LOW (ref 4.0–10.5)
nRBC: 0 % (ref 0.0–0.2)

## 2022-01-04 LAB — COMPREHENSIVE METABOLIC PANEL
ALT: 18 U/L (ref 0–44)
AST: 18 U/L (ref 15–41)
Albumin: 3.8 g/dL (ref 3.5–5.0)
Alkaline Phosphatase: 55 U/L (ref 38–126)
Anion gap: 7 (ref 5–15)
BUN: 15 mg/dL (ref 6–20)
CO2: 27 mmol/L (ref 22–32)
Calcium: 8.6 mg/dL — ABNORMAL LOW (ref 8.9–10.3)
Chloride: 107 mmol/L (ref 98–111)
Creatinine, Ser: 0.59 mg/dL (ref 0.44–1.00)
GFR, Estimated: >60 mL/min (ref >60)
Glucose, Bld: 95 mg/dL (ref 70–99)
Potassium: 3.6 mmol/L (ref 3.5–5.1)
Sodium: 141 mmol/L (ref 135–145)
Total Bilirubin: 0.6 mg/dL (ref 0.3–1.2)
Total Protein: 7.5 g/dL (ref 6.5–8.1)

## 2022-01-04 LAB — URINALYSIS, ROUTINE W REFLEX MICROSCOPIC
Bacteria, UA: NONE SEEN
Bilirubin Urine: NEGATIVE
Glucose, UA: NEGATIVE mg/dL
Ketones, ur: NEGATIVE mg/dL
Leukocytes,Ua: NEGATIVE
Nitrite: NEGATIVE
Protein, ur: NEGATIVE mg/dL
Specific Gravity, Urine: 1.018 (ref 1.005–1.030)
pH: 6 (ref 5.0–8.0)

## 2022-01-04 LAB — RESP PANEL BY RT-PCR (FLU A&B, COVID) ARPGX2
Influenza A by PCR: NEGATIVE
Influenza B by PCR: NEGATIVE
SARS Coronavirus 2 by RT PCR: NEGATIVE

## 2022-01-04 NOTE — ED Provider Notes (Signed)
[facility]-EMERGENCY DEPT Provider Note   CSN: [id number] Arrival date & time: 01/04/22  0930     History  Chief Complaint  Patient presents with   Headache    Bonnie Stewart is a 51 y.o. female, who presents to the ED secondary to intermittent headache for the last 3 months.  This states that for the last 2 weeks it has been especially bad, and almost a daily recurrence.  States that they typically happen on one side of her head, and have associated blurred vision, which resolves after the headache resolves.  States she goes to a dark place, where there is no light, or sound which helps with the pain.  Also takes naps which help with the pain.  Also takes Tylenol occasionally.  Has not been taking ibuprofen/Tylenol more than 10 days a month.  Denies any numbness to one side of the body, changes in speech, no medical problems other than high cholesterol.  Was seen in the ER yesterday, but left prior to being seen by MD/PA.  Notes that the headaches are worse with loud voices, bright lights.  No nausea or vomiting associated with it.  Denies any prodrome prior to the migraine.  Denies any headache pain at this time.     Home Medications Prior to Admission medications   Medication Sig Start Date End Date Taking? Authorizing Provider  acetaminophen (TYLENOL 8 HOUR) 650 MG CR tablet Take 1 tablet (650 mg total) by mouth every 8 (eight) hours as needed. Patient not taking: Reported on 12/20/2019 09/04/19   Derwood Kaplan, MD  acyclovir (ZOVIRAX) 400 MG tablet Take 1 tablet (400 mg total) by mouth 4 (four) times daily. 03/28/21   Mancel Bale, MD  Aspirin-Salicylamide-Caffeine (ARTHRITIS STRENGTH BC POWDER PO) Take 1 tablet by mouth as needed. Patient not taking: Reported on 12/20/2019    [provider]  ibuprofen (ADVIL) 800 MG tablet Take 1 tablet (800 mg total) by mouth every 8 (eight) hours as needed. Patient not taking: Reported on 12/20/2019 12/27/18    Brock Bad, MD  levonorgestrel [facility]) 20 MCG/24HR IUD 1 each by Intrauterine route once. Patient not taking: Reported on 12/20/2019    [provider]  loratadine (CLARITIN) 10 MG tablet Take 1 tablet (10 mg total) by mouth daily. To help with nasal congestion. Patient not taking: Reported on 12/20/2019 12/27/18   Brock Bad, MD  predniSONE (DELTASONE) 10 MG tablet Take 2 tablets (20 mg total) by mouth daily. 03/28/21   Mancel Bale, MD      Allergies    Patient has no known allergies.    Review of Systems   Review of Systems  Eyes:  Positive for photophobia.  Gastrointestinal:  Negative for nausea and vomiting.  Neurological:  Positive for headaches.    Physical Exam Updated Vital Signs BP (!) 145/89   Pulse 75   Temp 98 F (36.7 C) (Oral)   Resp 20   Ht 5\' 2"  (1.575 m)   Wt 63.5 kg   SpO2 98%   BMI 25.61 kg/m  Physical Exam Vitals and nursing note reviewed.  Constitutional:      General: She is not in acute distress.    Appearance: She is well-developed.  HENT:     Head: Normocephalic and atraumatic.  Eyes:     Conjunctiva/sclera: Conjunctivae normal.  Cardiovascular:     Rate and Rhythm: Normal rate and regular rhythm.     Heart sounds: No murmur heard.  Pulmonary:     Effort: Pulmonary effort is normal. No respiratory distress.     Breath sounds: Normal breath sounds.  Abdominal:     Palpations: Abdomen is soft.     Tenderness: There is no abdominal tenderness.  Musculoskeletal:        General: No swelling.     Cervical back: Neck supple.  Skin:    General: Skin is warm and dry.     Capillary Refill: Capillary refill takes less than 2 seconds.  Neurological:     Mental Status: She is alert.     Cranial Nerves: No cranial nerve deficit, dysarthria or facial asymmetry.     Sensory: No sensory deficit.     Motor: No weakness.     Coordination: Romberg sign negative. Coordination normal.     Gait: Gait normal.  Psychiatric:         Mood and Affect: Mood normal.     ED Results / Procedures / Treatments   Labs (all labs ordered are listed, but only abnormal results are displayed) Labs Reviewed  CBC WITH DIFFERENTIAL/PLATELET - Abnormal; Notable for the following components:      Result Value   WBC 3.7 (*)    All other components within normal limits  COMPREHENSIVE METABOLIC PANEL - Abnormal; Notable for the following components:   Calcium 8.6 (*)    All other components within normal limits  RESP PANEL BY RT-PCR (FLU A&B, COVID) ARPGX2  URINALYSIS, ROUTINE W REFLEX MICROSCOPIC    EKG None  Radiology CT Head Wo Contrast  Result Date: 01/03/2022 CLINICAL DATA:  Headache EXAM: CT HEAD WITHOUT CONTRAST TECHNIQUE: Contiguous axial images were obtained from the base of the skull through the vertex without intravenous contrast. RADIATION DOSE REDUCTION: This exam was performed according to the departmental dose-optimization program which includes automated exposure control, adjustment of the mA and/or kV according to patient size and/or use of iterative reconstruction technique. COMPARISON:  03/28/2021 FINDINGS: Brain: No evidence of acute infarction, hemorrhage, hydrocephalus, extra-axial collection or mass lesion/mass effect. Vascular: No hyperdense vessel or unexpected calcification. Skull: Normal. Negative for fracture or focal lesion. Sinuses/Orbits: No acute finding. Other: None. IMPRESSION: No acute intracranial pathology. No noncontrast CT findings to explain headache. Electronically Signed   By: Jearld Lesch M.D.   On: 01/03/2022 18:25    Procedures Procedures  Medications Ordered in ED Medications - No data to display  ED Course/ Medical Decision Making/ A&P                           Medical Decision Making Patient is a 51 year old female, here for headache that has been intermittent for the last couple months.  Is becoming more frequent in nature.  She was triage, and had a CT head performed last night,  which was unremarkable.  Labs ordered by triage provider were all unremarkable as well.  She is not on any oral contraceptives.  She denies any head trauma.  Symptoms align with migraine, discussed avoiding medication overuse headache, and follow-up with neurology as she has been having them more than 10 days a month now.  Information for neurology and return precautions emphasized.  She had no neurodeficits on exam, and is well-appearing.  She had no headache at my time of exam.    Final Clinical Impression(s) / ED Diagnoses Final diagnoses:  Other migraine without status migrainosus, not intractable    Rx / DC Orders ED Discharge Orders  None         Pete Pelt, [place] 01/04/22 1737    Rondel Baton, MD 01/05/22 [phone]

## 2022-01-04 NOTE — ED Notes (Signed)
Attempted to convince patient that she needed to stay for treatment. She was insistent on leaving

## 2022-01-04 NOTE — ED Triage Notes (Signed)
Patient reports that she has had an intermittent headache on the right side of her head x 2 weeks. Patient states she has taken Tylenol and helps at times. Patient also c/o blurry vision.

## 2022-01-04 NOTE — ED Provider Triage Note (Signed)
Emergency Medicine Provider Triage Evaluation Note  Bonnie Stewart , a 51 y.o. female  was evaluated in triage.  Pt complains of headache off and on for the past 2 weeks. Reports taht Tylneol sometimes helps. The patient was seen here yesterday, according to the notes, these headache has been for hte past few months fput more intermittent over th apast few days. .  Review of Systems  Positive:  Negative:   Physical Exam  BP (!) 153/103   Pulse 87   Temp 98.3 F (36.8 C) (Oral)   Resp 14   Ht 5\' 2"  (1.575 m)   Wt 63.5 kg   SpO2 99%   BMI 25.61 kg/m  Gen:   Awake, no distress   Resp:  Normal effort  MSK:   Moves extremities without difficulty  Other:  Cranial nerves grossly intact. No pronator drift.   Medical Decision Making  Medically screening exam initiated at 10:03 AM.  Appropriate orders placed.  Bonnie Stewart was informed that the remainder of the evaluation will be completed by another provider, this initial triage assessment does not replace that evaluation, and the importance of remaining in the ED until their evaluation is complete.  Patient had a CT of head yesterday with normal workup.   Donalda Ewings, PA-C 01/04/22 1006

## 2022-01-04 NOTE — Discharge Instructions (Addendum)
Please follow-up with your primary care doctor and neurologist since you are having frequent headaches.  Your CT scan was unremarkable yesterday.  He develops severe headache, numbness on one side of your body, loss of speech, please return to the ER immediately.

## 2022-10-07 IMAGING — CT CT HEAD W/O CM
3 of 4 series · 14 of 47 positions shown, 16 images · non-contrast
Comparison: None.

CLINICAL DATA: 50-year-old female with facial droop and headache
today. Initial encounter.



[Series 5: coronal soft tissue · coronal · 0.32mm/px · 3 of 69 slices shown]
[im 23/69  brain]
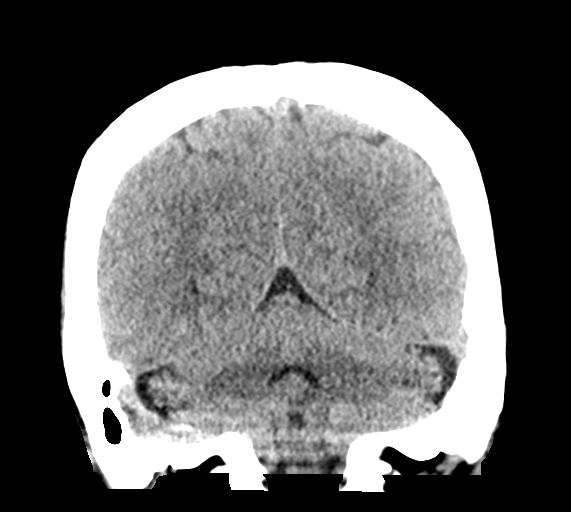
[im 31/69  brain]
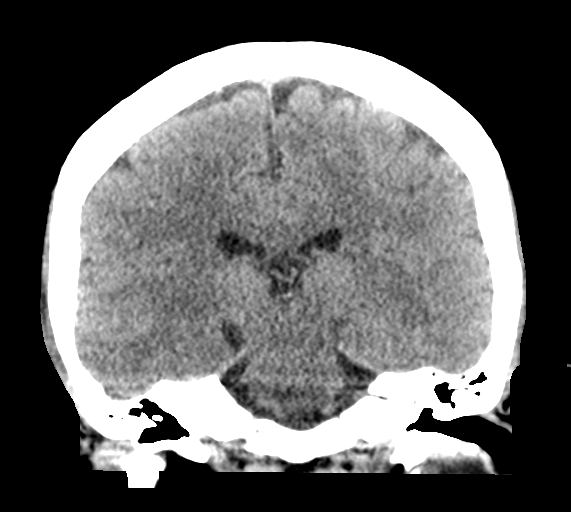
[im 38/69  brain]
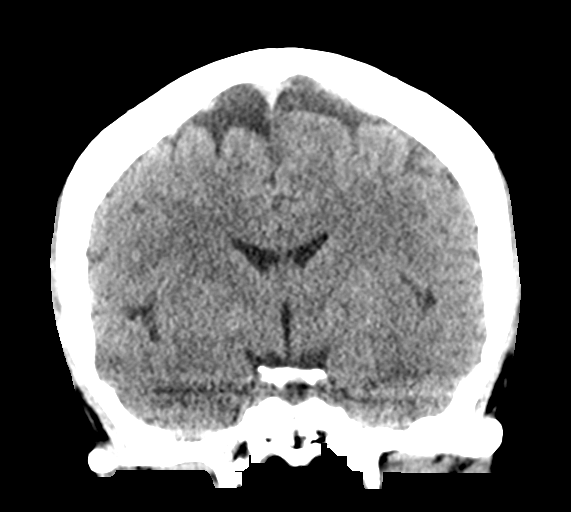

[Series 6: sagittal soft tissue · sagittal · 0.32mm/px · 3 of 62 slices shown]
[im 21/62  brain]
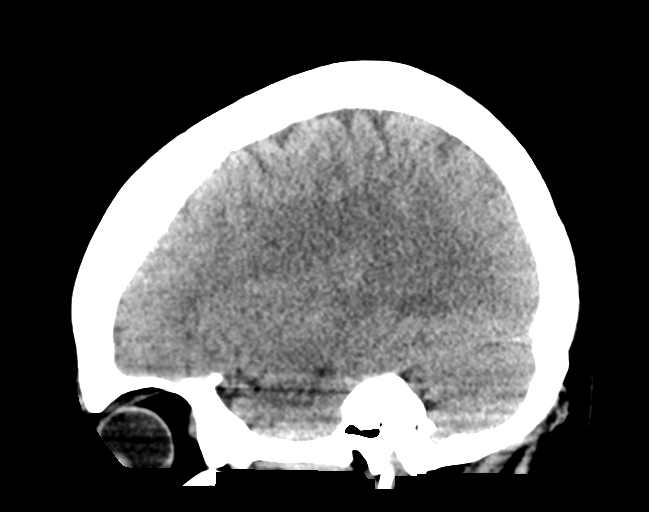
[im 31/62  brain]
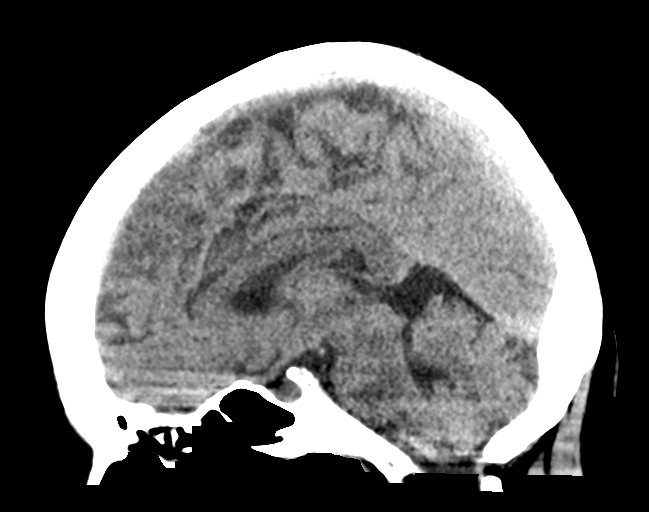
[im 41/62  brain]
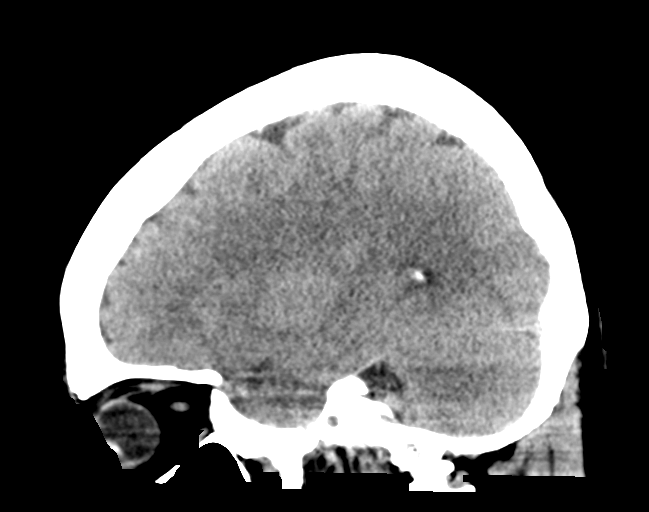

[Series 7: true axial · axial · 0.36mm/px · z∈[+1617,+1742]mm · 8 of 54 slices shown, 10 images]
[im 6/54  brain]
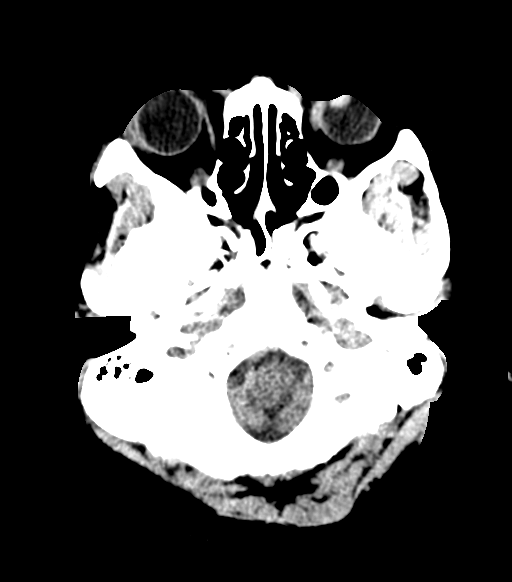
[im 6/54  bone]
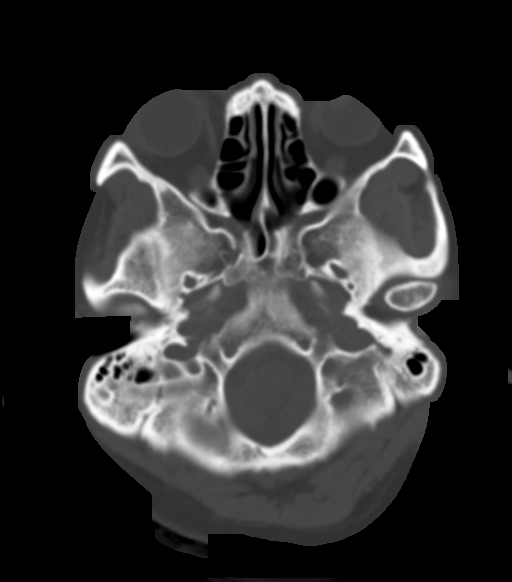
[im 12/54  brain]
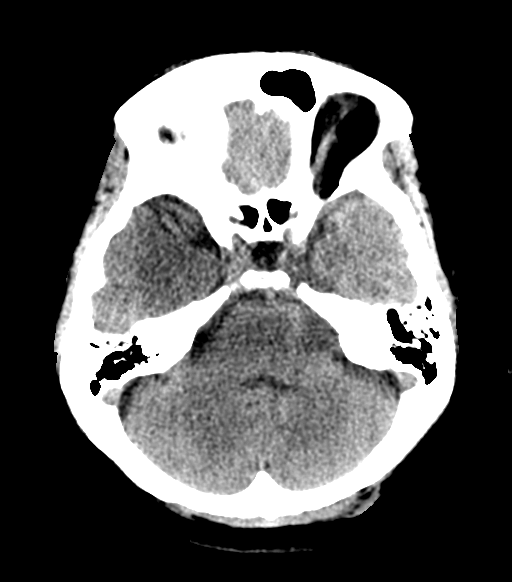
[im 18/54  brain]
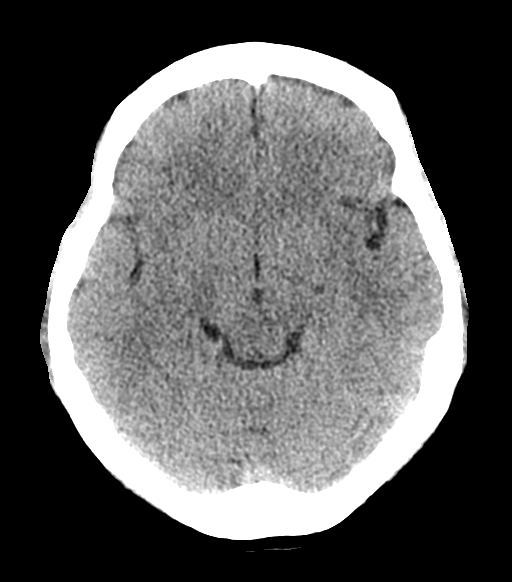
[im 24/54  brain]
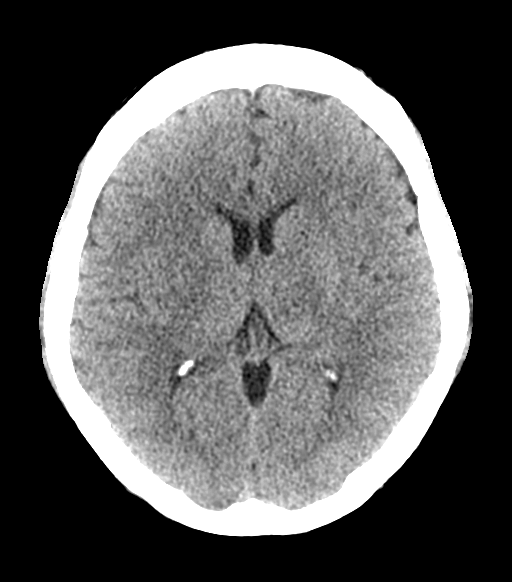
[im 30/54  brain]
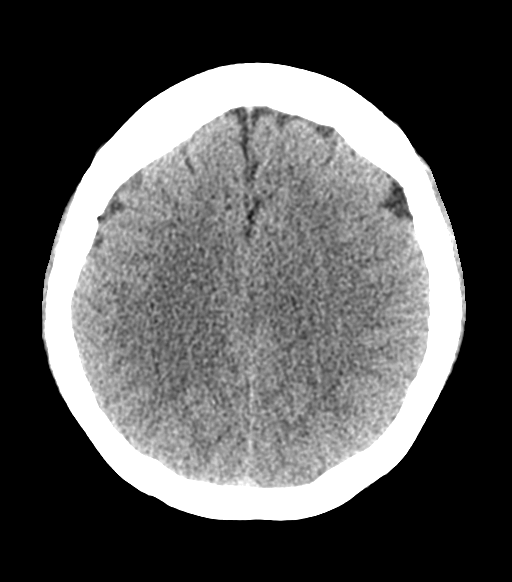
[im 30/54  bone]
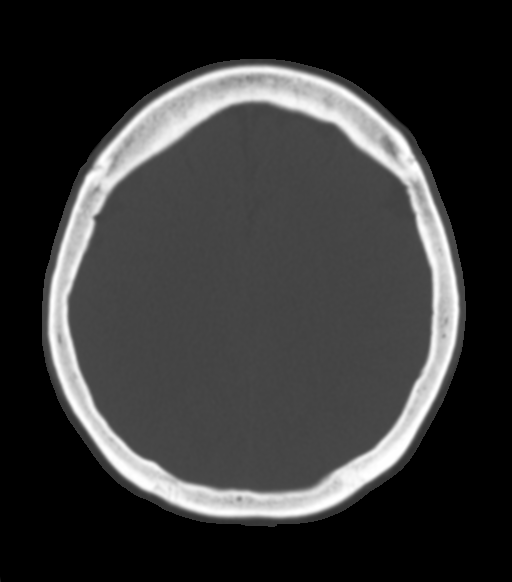
[im 36/54  brain]
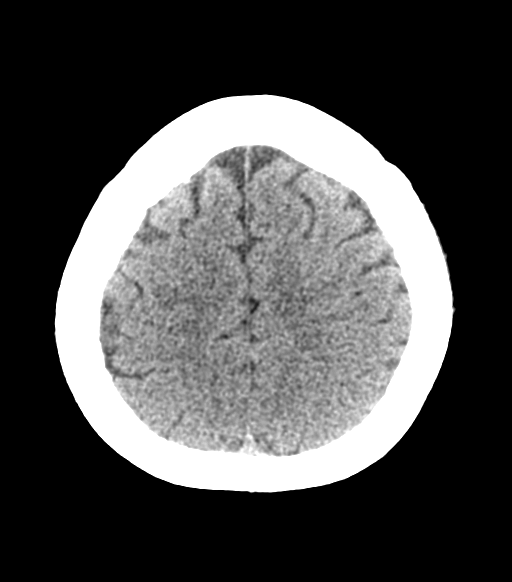
[im 42/54  brain]
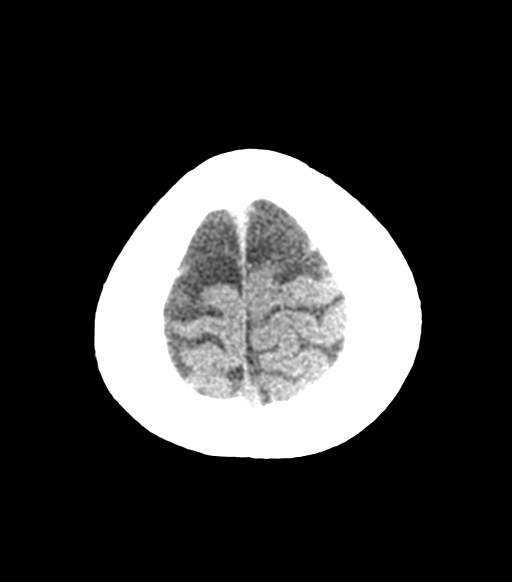
[im 48/54  brain]
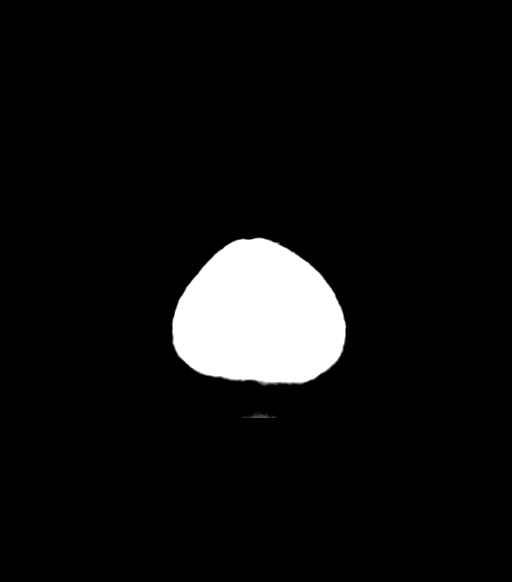

[14 of 47 positions shown; findings below may reference images not displayed]

FINDINGS: Brain: No evidence of acute infarction, hemorrhage, hydrocephalus,
extra-axial collection or mass lesion/mass effect.

Vascular: No hyperdense vessel or unexpected calcification.

Skull: Normal. Negative for fracture or focal lesion.

Sinuses/Orbits: No acute finding.

Other: None.
IMPRESSION: Unremarkable noncontrast head CT.

## 2022-11-22 ENCOUNTER — Other Ambulatory Visit: Payer: Self-pay | Admitting: Physician Assistant

## 2022-11-22 DIAGNOSIS — Z1231 Encounter for screening mammogram for malignant neoplasm of breast: Secondary | ICD-10-CM

## 2022-12-29 ENCOUNTER — Ambulatory Visit: Payer: Medicaid Other

## 2023-01-18 ENCOUNTER — Ambulatory Visit: Payer: Medicaid Other

## 2023-02-08 ENCOUNTER — Ambulatory Visit
Admission: RE | Admit: 2023-02-08 | Discharge: 2023-02-08 | Disposition: A | Discharge status: Home / Self Care | Payer: Medicaid Other | Source: Ambulatory Visit | Attending: Physician Assistant | Admitting: Physician Assistant

## 2023-02-08 DIAGNOSIS — Z1231 Encounter for screening mammogram for malignant neoplasm of breast: Secondary | ICD-10-CM

## 2024-01-09 ENCOUNTER — Other Ambulatory Visit: Payer: Self-pay | Admitting: Physician Assistant

## 2024-01-09 DIAGNOSIS — Z1231 Encounter for screening mammogram for malignant neoplasm of breast: Secondary | ICD-10-CM

## 2024-02-09 ENCOUNTER — Ambulatory Visit

## 2024-02-17 ENCOUNTER — Ambulatory Visit

## 2024-03-05 ENCOUNTER — Ambulatory Visit
Admission: RE | Admit: 2024-03-05 | Discharge: 2024-03-05 | Disposition: A | Source: Ambulatory Visit | Attending: Physician Assistant | Admitting: Physician Assistant

## 2024-03-05 DIAGNOSIS — Z1231 Encounter for screening mammogram for malignant neoplasm of breast: Secondary | ICD-10-CM
